# Patient Record
Sex: Male | Born: 1995 | Race: White | Hispanic: No | Marital: Single | Smoking: Never smoker
Health system: Southern US, Community
[De-identification: ages and names within clinical notes are randomized; demographics above are authoritative.]

---

## 2019-02-28 ENCOUNTER — Ambulatory Visit (HOSPITAL_COMMUNITY)
Admission: EM | Admit: 2019-02-28 | Discharge: 2019-02-28 | Disposition: A | Discharge status: Home / Self Care | Payer: Self-pay | Attending: Family Medicine | Admitting: Family Medicine

## 2019-02-28 ENCOUNTER — Encounter (HOSPITAL_COMMUNITY): Payer: Self-pay

## 2019-02-28 ENCOUNTER — Other Ambulatory Visit: Payer: Self-pay

## 2019-02-28 DIAGNOSIS — J45909 Unspecified asthma, uncomplicated: Secondary | ICD-10-CM

## 2019-02-28 MED ORDER — PREDNISONE 10 MG PO TABS: 20.0000 mg | ORAL_TABLET | Freq: Every day | ORAL | 0 refills | Status: DC

## 2019-02-28 NOTE — ED Provider Notes (Signed)
[facility]    CSN: [id number] Arrival date & time: 02/28/19  1412      History   Chief Complaint Chief Complaint  Patient presents with  . Back Pain    HPI Jvon Meroney is a 23 y.o. male.   Back pain seems to be part of the problem but most of the symptoms that he describes relate to some chest discomfort and shortness of breath.  He denies asthma or wheezing.  He has some cough.  He does smoke occasionally.  HPI  History reviewed. No pertinent past medical history.  There are no active problems to display for this patient.   History reviewed. No pertinent surgical history.     Home Medications    Prior to Admission medications   Not on File    Family History History reviewed. No pertinent family history.  Social History Social History   Tobacco Use  . Smoking status: Current Every Day Smoker  . Smokeless tobacco: Never Used  Substance Use Topics  . Alcohol use: Yes  . Drug use: Never     Allergies   Patient has no known allergies.   Review of Systems Review of Systems  Constitutional: Negative.   HENT: Negative.   Respiratory: Positive for shortness of breath.   Cardiovascular: Positive for chest pain.  All other systems reviewed and are negative.    Physical Exam Triage Vital Signs ED Triage Vitals  Enc Vitals Group     BP 02/28/19 1436 139/73     Pulse Rate 02/28/19 1436 77     Resp 02/28/19 1436 16     Temp 02/28/19 1436 98.4 F (36.9 C)     Temp Source 02/28/19 1436 Oral     SpO2 02/28/19 1436 99 %     Weight 02/28/19 1432 160 lb (72.6 kg)     Height --      Head Circumference --      Peak Flow --      Pain Score 02/28/19 1432 3     Pain Loc --      Pain Edu? --      Excl. in [place]? --    No data found.  Updated Vital Signs BP 139/73 (BP Location: Right Arm)   Pulse 77   Temp 98.4 F (36.9 C) (Oral)   Resp 16   Wt 72.6 kg   SpO2 99%   Visual Acuity Right Eye Distance:   Left Eye Distance:    Bilateral Distance:    Right Eye Near:   Left Eye Near:    Bilateral Near:     Physical Exam Constitutional:      Appearance: Normal appearance.  Cardiovascular:     Rate and Rhythm: Normal rate and regular rhythm.  Pulmonary:     Effort: Pulmonary effort is normal.     Breath sounds: Normal breath sounds.  Musculoskeletal: Normal range of motion.  Neurological:     General: No focal deficit present.     Mental Status: He is alert.      UC Treatments / Results  Labs (all labs ordered are listed, but only abnormal results are displayed) Labs Reviewed - No data to display  EKG   Radiology No results found.  Procedures Procedures (including critical care time)  Medications Ordered in UC Medications - No data to display  Initial Impression / Assessment and Plan / UC Course  I have reviewed the triage vital signs and the nursing notes.  Pertinent labs &  imaging results that were available during my care of the patient were reviewed by me and considered in my medical decision making (see chart for details).   Symptoms suggest reactive airways but are vague. Cannot afford steroid inhaler so will try tapered dose oral steroid   Final Clinical Impressions(s) / UC Diagnoses   Final diagnoses:  None   Discharge Instructions   None    ED Prescriptions    None     Controlled Substance Prescriptions [place] Controlled Substance Registry consulted? No   Frederica KusterMiller, Windell Musson M, MD 02/28/19 [id number]

## 2019-02-28 NOTE — ED Triage Notes (Signed)
Pt states he has pain  in his back that radiate into his chest x 2 weeks pt states he took Ibuprofen to relief the pain.

## 2019-05-24 ENCOUNTER — Encounter (HOSPITAL_COMMUNITY): Payer: Self-pay

## 2019-05-24 ENCOUNTER — Encounter (HOSPITAL_COMMUNITY)
Admission: EM | Disposition: A | Discharge status: Home / Self Care | Payer: Self-pay | Source: Home / Self Care | Attending: Emergency Medicine

## 2019-05-24 ENCOUNTER — Ambulatory Visit (HOSPITAL_COMMUNITY)
Admission: EM | Admit: 2019-05-24 | Discharge: 2019-05-25 | Disposition: A | Discharge status: Home / Self Care | Payer: Self-pay | Attending: Emergency Medicine | Admitting: Emergency Medicine

## 2019-05-24 ENCOUNTER — Other Ambulatory Visit: Payer: Self-pay

## 2019-05-24 ENCOUNTER — Emergency Department (HOSPITAL_COMMUNITY): Payer: Self-pay

## 2019-05-24 ENCOUNTER — Encounter (HOSPITAL_COMMUNITY): Payer: Self-pay | Admitting: Emergency Medicine

## 2019-05-24 ENCOUNTER — Ambulatory Visit (HOSPITAL_COMMUNITY)
Admission: EM | Admit: 2019-05-24 | Discharge: 2019-05-24 | Payer: Self-pay | Attending: Family Medicine | Admitting: Family Medicine

## 2019-05-24 DIAGNOSIS — S60511A Abrasion of right hand, initial encounter: Secondary | ICD-10-CM | POA: Insufficient documentation

## 2019-05-24 DIAGNOSIS — S02651A Fracture of angle of right mandible, initial encounter for closed fracture: Secondary | ICD-10-CM | POA: Insufficient documentation

## 2019-05-24 DIAGNOSIS — S02602A Fracture of unspecified part of body of left mandible, initial encounter for closed fracture: Secondary | ICD-10-CM | POA: Insufficient documentation

## 2019-05-24 DIAGNOSIS — Z23 Encounter for immunization: Secondary | ICD-10-CM | POA: Insufficient documentation

## 2019-05-24 DIAGNOSIS — S0181XA Laceration without foreign body of other part of head, initial encounter: Secondary | ICD-10-CM

## 2019-05-24 DIAGNOSIS — F17200 Nicotine dependence, unspecified, uncomplicated: Secondary | ICD-10-CM | POA: Insufficient documentation

## 2019-05-24 DIAGNOSIS — S02609A Fracture of mandible, unspecified, initial encounter for closed fracture: Secondary | ICD-10-CM

## 2019-05-24 DIAGNOSIS — S61211A Laceration without foreign body of left index finger without damage to nail, initial encounter: Secondary | ICD-10-CM | POA: Insufficient documentation

## 2019-05-24 DIAGNOSIS — S60512A Abrasion of left hand, initial encounter: Secondary | ICD-10-CM | POA: Insufficient documentation

## 2019-05-24 DIAGNOSIS — S0993XA Unspecified injury of face, initial encounter: Secondary | ICD-10-CM

## 2019-05-24 DIAGNOSIS — S0990XA Unspecified injury of head, initial encounter: Secondary | ICD-10-CM

## 2019-05-24 DIAGNOSIS — Z20828 Contact with and (suspected) exposure to other viral communicable diseases: Secondary | ICD-10-CM | POA: Insufficient documentation

## 2019-05-24 DIAGNOSIS — S01112A Laceration without foreign body of left eyelid and periocular area, initial encounter: Secondary | ICD-10-CM | POA: Insufficient documentation

## 2019-05-24 HISTORY — PX: CLOSED REDUCTION MANDIBLE WITH MANDIBULOMA: SHX5313

## 2019-05-24 HISTORY — PX: FACIAL LACERATION REPAIR: SHX6589

## 2019-05-24 LAB — RESPIRATORY PANEL BY RT PCR (FLU A&B, COVID)
Influenza A by PCR: NEGATIVE
Influenza B by PCR: NEGATIVE
SARS Coronavirus 2 by RT PCR: NEGATIVE

## 2019-05-24 SURGERY — CLOSED REDUCTION, MANDIBLE, WITH ARCH BAR APPLICATION AND INTERMAXILLARY FIXATION
Anesthesia: General | Site: Mouth

## 2019-05-24 MED ORDER — BACITRACIN ZINC 500 UNIT/GM EX OINT
TOPICAL_OINTMENT | Freq: Once | CUTANEOUS | Status: AC
  Administered 2019-05-24: 1 via TOPICAL

## 2019-05-24 MED ORDER — ARTIFICIAL TEARS OPHTHALMIC OINT
TOPICAL_OINTMENT | OPHTHALMIC | Status: AC
  Filled 2019-05-24: qty 3.5

## 2019-05-24 MED ORDER — SUCCINYLCHOLINE CHLORIDE 200 MG/10ML IV SOSY
PREFILLED_SYRINGE | INTRAVENOUS | Status: AC
  Filled 2019-05-24: qty 10

## 2019-05-24 MED ORDER — MORPHINE SULFATE (PF) 4 MG/ML IV SOLN
4.0000 mg | Freq: Once | INTRAVENOUS | Status: AC
  Administered 2019-05-24: 23:00:00 4 mg via INTRAVENOUS
  Filled 2019-05-24: qty 1

## 2019-05-24 MED ORDER — LIDOCAINE-EPINEPHRINE (PF) 2 %-1:200000 IJ SOLN
10.0000 mL | Freq: Once | INTRAMUSCULAR | Status: AC
  Administered 2019-05-24: 10 mL
  Filled 2019-05-24: qty 20

## 2019-05-24 MED ORDER — LIDOCAINE 2% (20 MG/ML) 5 ML SYRINGE
INTRAMUSCULAR | Status: AC
  Filled 2019-05-24: qty 5

## 2019-05-24 MED ORDER — MIDAZOLAM HCL 2 MG/2ML IJ SOLN
INTRAMUSCULAR | Status: AC
  Filled 2019-05-24: qty 2

## 2019-05-24 MED ORDER — EPHEDRINE 5 MG/ML INJ
INTRAVENOUS | Status: AC
  Filled 2019-05-24: qty 10

## 2019-05-24 MED ORDER — ONDANSETRON HCL 4 MG/2ML IJ SOLN
4.0000 mg | Freq: Once | INTRAMUSCULAR | Status: AC
  Administered 2019-05-24: 23:00:00 4 mg via INTRAVENOUS
  Filled 2019-05-24: qty 2

## 2019-05-24 MED ORDER — ROCURONIUM BROMIDE 10 MG/ML (PF) SYRINGE
PREFILLED_SYRINGE | INTRAVENOUS | Status: AC
  Filled 2019-05-24: qty 10

## 2019-05-24 MED ORDER — ONDANSETRON HCL 4 MG/2ML IJ SOLN
INTRAMUSCULAR | Status: AC
  Filled 2019-05-24: qty 2

## 2019-05-24 MED ORDER — FENTANYL CITRATE (PF) 250 MCG/5ML IJ SOLN
INTRAMUSCULAR | Status: AC
  Filled 2019-05-24: qty 5

## 2019-05-24 MED ORDER — LIDOCAINE-EPINEPHRINE 1 %-1:100000 IJ SOLN
INTRAMUSCULAR | Status: AC
  Filled 2019-05-24: qty 1

## 2019-05-24 MED ORDER — PHENYLEPHRINE 40 MCG/ML (10ML) SYRINGE FOR IV PUSH (FOR BLOOD PRESSURE SUPPORT)
PREFILLED_SYRINGE | INTRAVENOUS | Status: AC
  Filled 2019-05-24: qty 10

## 2019-05-24 MED ORDER — LIDOCAINE HCL (PF) 1 % IJ SOLN
10.0000 mL | Freq: Once | INTRAMUSCULAR | Status: AC
  Administered 2019-05-24: 10 mL
  Filled 2019-05-24: qty 10

## 2019-05-24 MED ORDER — DIPHENHYDRAMINE HCL 50 MG/ML IJ SOLN
INTRAMUSCULAR | Status: AC
  Filled 2019-05-24: qty 1

## 2019-05-24 MED ORDER — TETANUS-DIPHTH-ACELL PERTUSSIS 5-2.5-18.5 LF-MCG/0.5 IM SUSP
0.5000 mL | Freq: Once | INTRAMUSCULAR | Status: AC
  Administered 2019-05-24: 0.5 mL via INTRAMUSCULAR
  Filled 2019-05-24: qty 0.5

## 2019-05-24 MED ORDER — PROPOFOL 10 MG/ML IV BOLUS
INTRAVENOUS | Status: AC
  Filled 2019-05-24: qty 20

## 2019-05-24 SURGICAL SUPPLY — 41 items
BLADE SURG 15 STRL LF DISP TIS (BLADE) IMPLANT
BLADE SURG 15 STRL SS (BLADE)
CANISTER SUCT 3000ML PPV (MISCELLANEOUS) ×4 IMPLANT
COVER SURGICAL LIGHT HANDLE (MISCELLANEOUS) ×4 IMPLANT
COVER WAND RF STERILE (DRAPES) IMPLANT
DECANTER SPIKE VIAL GLASS SM (MISCELLANEOUS) IMPLANT
DRAPE HALF SHEET 40X57 (DRAPES) IMPLANT
ELECT COATED BLADE 2.86 ST (ELECTRODE) IMPLANT
ELECT NEEDLE BLADE 2-5/6 (NEEDLE) ×4 IMPLANT
ELECT REM PT RETURN 9FT ADLT (ELECTROSURGICAL) ×4
ELECTRODE REM PT RTRN 9FT ADLT (ELECTROSURGICAL) ×2 IMPLANT
GAUZE 4X4 16PLY RFD (DISPOSABLE) IMPLANT
GLOVE BIOGEL M 7.0 STRL (GLOVE) IMPLANT
GLOVE BIOGEL PI IND STRL 7.0 (GLOVE) ×4 IMPLANT
GLOVE BIOGEL PI INDICATOR 7.0 (GLOVE) ×4
GLOVE ECLIPSE 7.5 STRL STRAW (GLOVE) ×4 IMPLANT
GOWN STRL REUS W/ TWL LRG LVL3 (GOWN DISPOSABLE) ×4 IMPLANT
GOWN STRL REUS W/TWL LRG LVL3 (GOWN DISPOSABLE) ×4
KIT BASIN OR (CUSTOM PROCEDURE TRAY) ×4 IMPLANT
KIT TURNOVER KIT B (KITS) ×4 IMPLANT
NEEDLE HYPO 25GX1X1/2 BEV (NEEDLE) IMPLANT
NEEDLE PRECISIONGLIDE 27X1.5 (NEEDLE) ×4 IMPLANT
NS IRRIG 1000ML POUR BTL (IV SOLUTION) ×4 IMPLANT
PAD ARMBOARD 7.5X6 YLW CONV (MISCELLANEOUS) ×8 IMPLANT
PENCIL BUTTON HOLSTER BLD 10FT (ELECTRODE) ×4 IMPLANT
SCREW UPPER FACE 2.0X8MM (Screw) ×16 IMPLANT
SOL PREP POV-IOD 4OZ 10% (MISCELLANEOUS) ×4 IMPLANT
SUCTION FRAZIER HANDLE 10FR (MISCELLANEOUS)
SUCTION TUBE FRAZIER 10FR DISP (MISCELLANEOUS) IMPLANT
SUT CHROMIC 3 0 PS 2 (SUTURE) IMPLANT
SUT CHROMIC 4 0 PS 2 18 (SUTURE) IMPLANT
SUT PROLENE 5 0 PS 2 (SUTURE) ×4 IMPLANT
SUT STEEL 1 (SUTURE) ×4 IMPLANT
SUT VIC AB 4-0 PS2 18 (SUTURE) ×4 IMPLANT
SYR CONTROL 10ML LL (SYRINGE) ×4 IMPLANT
TOOTHBRUSH ADULT (PERSONAL CARE ITEMS) ×4 IMPLANT
TRAY ENT MC OR (CUSTOM PROCEDURE TRAY) ×4 IMPLANT
TUBE CONNECTING 12'X1/4 (SUCTIONS) ×1
TUBE CONNECTING 12X1/4 (SUCTIONS) ×3 IMPLANT
WATER STERILE IRR 1000ML POUR (IV SOLUTION) ×4 IMPLANT
YANKAUER SUCT BULB TIP NO VENT (SUCTIONS) ×4 IMPLANT

## 2019-05-24 NOTE — Anesthesia Preprocedure Evaluation (Addendum)
Anesthesia Evaluation  Patient identified by MRN, date of birth, ID band Patient awake    Reviewed: Allergy & Precautions, NPO status , Patient's Chart, lab work & pertinent test results  History of Anesthesia Complications Negative for: history of anesthetic complications  Airway Mallampati: IV  TM Distance: >3 FB Neck ROM: Full  Mouth opening: Limited Mouth Opening  Dental  (+) Loose, Dental Advisory Given   Pulmonary neg recent URI, Current Smoker and Patient abstained from smoking.,    breath sounds clear to auscultation       Cardiovascular negative cardio ROS   Rhythm:Regular     Neuro/Psych negative neurological ROS  negative psych ROS   GI/Hepatic negative GI ROS, (+)     substance abuse  marijuana use,   Endo/Other  negative endocrine ROS  Renal/GU negative Renal ROS     Musculoskeletal negative musculoskeletal ROS (+)   Abdominal   Peds  Hematology negative hematology ROS (+)   Anesthesia Other Findings   Reproductive/Obstetrics                           Anesthesia Physical Anesthesia Plan  ASA: II  Anesthesia Plan: General   Post-op Pain Management:    Induction: Intravenous and Rapid sequence  PONV Risk Score and Plan: 1 and Ondansetron and Dexamethasone  Airway Management Planned: Nasal ETT  Additional Equipment: None  Intra-op Plan:   Post-operative Plan: Extubation in OR  Informed Consent: I have reviewed the patients History and Physical, chart, labs and discussed the procedure including the risks, benefits and alternatives for the proposed anesthesia with the patient or authorized representative who has indicated his/her understanding and acceptance.     Dental advisory given  Plan Discussed with: CRNA, Surgeon and Anesthesiologist  Anesthesia Plan Comments:        Anesthesia Quick Evaluation
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
## 2019-05-24 NOTE — Discharge Instructions (Addendum)
You will need special jaw films to better evaluate the jaw pain The lacerations will also need attention in the emergency department.
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
## 2019-05-24 NOTE — ED Notes (Signed)
Deep laceration over left eye bleeding, gauze applied and pt instructed to hold pressure. Pressure bandage applied to finger on left hand where there is a deep laceration, no bone visible.
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
## 2019-05-25 ENCOUNTER — Emergency Department (HOSPITAL_COMMUNITY): Payer: Self-pay | Admitting: Anesthesiology

## 2019-05-25 ENCOUNTER — Encounter (HOSPITAL_COMMUNITY): Payer: Self-pay | Admitting: Otolaryngology

## 2019-05-25 MED ORDER — CLINDAMYCIN PHOSPHATE 900 MG/50ML IV SOLN
INTRAVENOUS | Status: AC
  Filled 2019-05-25: qty 50

## 2019-05-25 MED ORDER — DEXAMETHASONE SODIUM PHOSPHATE 4 MG/ML IJ SOLN
INTRAMUSCULAR | Status: DC | PRN
  Administered 2019-05-25: 10 mg via INTRAVENOUS

## 2019-05-25 MED ORDER — SUCCINYLCHOLINE CHLORIDE 200 MG/10ML IV SOSY
PREFILLED_SYRINGE | INTRAVENOUS | Status: DC | PRN
  Administered 2019-05-25: 100 mg via INTRAVENOUS

## 2019-05-25 MED ORDER — ACETAMINOPHEN 160 MG/5ML PO SOLN: 1000.0000 mg | Freq: Once | ORAL | Status: DC | PRN

## 2019-05-25 MED ORDER — ONDANSETRON HCL 4 MG/2ML IJ SOLN
INTRAMUSCULAR | Status: DC | PRN
  Administered 2019-05-25: 4 mg via INTRAVENOUS

## 2019-05-25 MED ORDER — PROPOFOL 10 MG/ML IV BOLUS
INTRAVENOUS | Status: DC | PRN
  Administered 2019-05-25: 140 mg via INTRAVENOUS

## 2019-05-25 MED ORDER — FENTANYL CITRATE (PF) 250 MCG/5ML IJ SOLN
INTRAMUSCULAR | Status: DC | PRN
  Administered 2019-05-25 (×5): 50 ug via INTRAVENOUS

## 2019-05-25 MED ORDER — OXYCODONE HCL 5 MG/5ML PO SOLN: 5.0000 mg | Freq: Once | ORAL | Status: DC | PRN

## 2019-05-25 MED ORDER — MIDAZOLAM HCL 5 MG/5ML IJ SOLN
INTRAMUSCULAR | Status: DC | PRN
  Administered 2019-05-25: 2 mg via INTRAVENOUS

## 2019-05-25 MED ORDER — CHLORHEXIDINE GLUCONATE 0.12 % MT SOLN
OROMUCOSAL | Status: AC
  Filled 2019-05-25: qty 15

## 2019-05-25 MED ORDER — CLINDAMYCIN PHOSPHATE 900 MG/50ML IV SOLN
INTRAVENOUS | Status: DC | PRN
  Administered 2019-05-25: 900 mg via INTRAVENOUS

## 2019-05-25 MED ORDER — LIDOCAINE-EPINEPHRINE 1 %-1:100000 IJ SOLN
INTRAMUSCULAR | Status: DC | PRN
  Administered 2019-05-25: 20 mL via INTRADERMAL

## 2019-05-25 MED ORDER — HYDROCODONE-ACETAMINOPHEN 7.5-325 MG/15ML PO SOLN: 15.0000 mL | ORAL | 0 refills | Status: AC | PRN

## 2019-05-25 MED ORDER — ACETAMINOPHEN 10 MG/ML IV SOLN: 1000.0000 mg | Freq: Once | INTRAVENOUS | Status: DC | PRN

## 2019-05-25 MED ORDER — DIPHENHYDRAMINE HCL 50 MG/ML IJ SOLN
INTRAMUSCULAR | Status: DC | PRN
  Administered 2019-05-25: 25 mg via INTRAVENOUS

## 2019-05-25 MED ORDER — OXYCODONE HCL 5 MG PO TABS: 5.0000 mg | ORAL_TABLET | Freq: Once | ORAL | Status: DC | PRN

## 2019-05-25 MED ORDER — OXYMETAZOLINE HCL 0.05 % NA SOLN
NASAL | Status: AC
  Filled 2019-05-25: qty 30

## 2019-05-25 MED ORDER — CHLORHEXIDINE GLUCONATE 0.12 % MT SOLN
OROMUCOSAL | Status: DC | PRN
  Administered 2019-05-25: 15 mL via OROMUCOSAL

## 2019-05-25 MED ORDER — AMOXICILLIN 400 MG/5ML PO SUSR: 800.0000 mg | Freq: Two times a day (BID) | ORAL | 0 refills | Status: AC

## 2019-05-25 MED ORDER — LIDOCAINE HCL (CARDIAC) PF 100 MG/5ML IV SOSY
PREFILLED_SYRINGE | INTRAVENOUS | Status: DC | PRN
  Administered 2019-05-25: 60 mg via INTRATRACHEAL

## 2019-05-25 MED ORDER — STERILE WATER FOR IRRIGATION IR SOLN
Status: DC | PRN
  Administered 2019-05-25: 500 mL

## 2019-05-25 MED ORDER — LACTATED RINGERS IV SOLN
INTRAVENOUS | Status: DC | PRN
  Administered 2019-05-25 (×2): via INTRAVENOUS

## 2019-05-25 MED ORDER — ACETAMINOPHEN 500 MG PO TABS: 1000.0000 mg | ORAL_TABLET | Freq: Once | ORAL | Status: DC | PRN

## 2019-05-25 MED ORDER — OXYMETAZOLINE HCL 0.05 % NA SOLN
NASAL | Status: DC | PRN
  Administered 2019-05-25: 2 via NASAL

## 2019-05-25 MED ORDER — CHLORHEXIDINE GLUCONATE 0.12 % MT SOLN: OROMUCOSAL | 3 refills | Status: DC

## 2019-05-25 MED ORDER — FENTANYL CITRATE (PF) 100 MCG/2ML IJ SOLN: 25.0000 ug | INTRAMUSCULAR | Status: DC | PRN

## 2019-05-25 MED ORDER — 0.9 % SODIUM CHLORIDE (POUR BTL) OPTIME
TOPICAL | Status: DC | PRN
  Administered 2019-05-25: 1000 mL

## 2019-05-25 NOTE — Anesthesia Procedure Notes (Signed)
Procedure Name: Intubation Date/Time: 05/25/2019 12:48 AM Performed by: Clovis Cao, CRNA Pre-anesthesia Checklist: Patient identified, Emergency Drugs available, Suction available, Patient being monitored and Timeout performed Patient Re-evaluated:Patient Re-evaluated prior to induction Oxygen Delivery Method: Circle system utilized Preoxygenation: Pre-oxygenation with 100% oxygen Induction Type: IV induction, Rapid sequence and Cricoid Pressure applied Laryngoscope Size: Mac and 3 Grade View: Grade I Nasal Tubes: Right, Nasal prep performed, Nasal Rae and Magill forceps- large, utilized Tube size: 7.5 mm Number of attempts: 1 Placement Confirmation: ETT inserted through vocal cords under direct vision,  positive ETCO2 and breath sounds checked- equal and bilateral Secured at: 28 cm Tube secured with: Tape Dental Injury: Teeth and Oropharynx as per pre-operative assessment
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
## 2019-05-25 NOTE — Transfer of Care (Signed)
Immediate Anesthesia Transfer of Care Note  Patient: Jahi Roza  Procedure(s) Performed: CLOSED REDUCTION MANDIBLE (N/A Mouth)  Patient Location: PACU  Anesthesia Type:General  Level of Consciousness: drowsy  Airway & Oxygen Therapy: Patient Spontanous Breathing  Post-op Assessment: Report given to RN and Post -op Vital signs reviewed and stable  Post vital signs: Reviewed and stable  Last Vitals:  Vitals Value Taken Time  BP 143/92 05/25/19 0129  Temp    Pulse 85 05/25/19 0130  Resp 17 05/25/19 0130  SpO2 99 % 05/25/19 0130  Vitals shown include unvalidated device data.  Last Pain:  Vitals:   05/24/19 1955  TempSrc:   PainSc: 5          Complications: No apparent anesthesia complications
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
## 2019-05-25 NOTE — Discharge Instructions (Addendum)
Post Anesthesia Home Care Instructions  Activity: Get plenty of rest for the remainder of the day. A responsible individual must stay with you for 24 hours following the procedure.  For the next 24 hours, DO NOT: -Drive a car -Advertising copywriter -Drink alcoholic beverages -Take any medication unless instructed by your physician -Make any legal decisions or sign important papers.  Meals: Start with liquid foods such as gelatin or soup. Progress to regular foods as tolerated. Avoid greasy, spicy, heavy foods. If nausea and/or vomiting occur, drink only clear liquids until the nausea and/or vomiting subsides. Call your physician if vomiting continues.  Special Instructions/Symptoms: Your throat may feel dry or sore from the anesthesia or the breathing tube placed in your throat during surgery. If this causes discomfort, gargle with warm salt water. The discomfort should disappear within 24 hours.  If you had a scopolamine patch placed behind your ear for the management of post- operative nausea and/or vomiting:  1. The medication in the patch is effective for 72 hours, after which it should be removed.  Wrap patch in a tissue and discard in the trash. Wash hands thoroughly with soap and water. 2. You may remove the patch earlier than 72 hours if you experience unpleasant side effects which may include dry mouth, dizziness or visual disturbances. 3. Avoid touching the patch. Wash your hands with soap and water after contact with the patch.      Facial Laceration A facial laceration is a cut on the face. This can happen because of an accident or injury that cuts or tears the skin or tissues on your face. These injuries can hurt and bleed. Some cuts may need to be closed with stitches (sutures), skin glue, or skin tape (adhesive) strips. Cuts usually heal quickly, but they can leave a scar. It can take 1-2 years for the scar to go away completely. Follow these instructions at home: Wound  care  Follow your doctor's instructions for wound care. These instructions will vary depending on how the wound was closed. ? For stitches:  Keep the wound clean and dry.  If you were given a bandage (dressing), change it at least one time a day, or as told by your doctor. Also change the bandage if it gets wet or dirty.  Wash the wound with soap and water two times a day, or as told by your doctor. Rinse off the soap with water. Use a clean towel to pat the wound dry.  After cleaning, apply a thin layer of antibiotic ointment as told by your doctor. This helps prevent infection and keeps the bandage from sticking to the wound.  You may shower as usual after the first 24 hours. Do not soak the wound until the stitches are taken out.  Go back to have your stitches taken out as told by your doctor.  Do not wear makeup until your doctor says it is okay. ? For skin tape strips:  Keep the wound clean and dry.  Do not let the skin tape strips get wet.  Bathe carefully to keep the wound and skin tape strips dry. If the wound gets wet, pat it dry with a clean towel right away.  Skin tape strips fall off on their own over time. You may trim the strips as the wound heals. Do not take off skin tape strips that are still stuck to the wound. ? For skin glue:  You may briefly wet your wound in the shower or bath.  Do not soak or scrub the wound.  Do not swim.  Do not do anything that makes you sweat a lot until the skin glue has fallen off on its own.  After you shower or take a bath, use a clean towel to gently pat the wound dry.  Do not put liquid medicine, cream medicine, ointment, or makeup on your wound while the skin glue is in place. This may loosen the film before your wound is healed.  If you have a bandage over your wound, be careful not to apply tape directly over the skin glue. This may pull off the skin glue before the wound is healed.  Do not spend a long time in the sun or  use a tanning lamp while the skin glue is in place.  The skin glue usually stays in place for 5-10 days. Then, it naturally falls off the skin. Do not pick at the skin glue. General instructions   Take over-the-counter and prescription medicines only as told by your doctor.  Check your wound area every day for signs of infection. Check for: ? More redness, swelling, or pain. ? More fluid or blood. ? Warmth. ? Pus or a bad smell.  If you were prescribed an antibiotic, take or apply it as told by your doctor. Do not stop taking or applying the antibiotic even if your condition improves.  After the cut has healed: ? Know that it can take a year or two for redness or scarring to fade. ? Apply sunscreen to the skin of your healed wound to minimize scarring. Ultraviolet (UV) rays can darken scar tissue. Contact a doctor if:  You have a fever.  You have more redness, swelling, or pain around your wound.  You have more fluid or blood coming from your wound.  Your wound feels warm to the touch.  You have pus or a bad smell coming from your wound. Get help right away if:  You have a red streak going away from your wound. Summary  A cut on the face (facial laceration) may need to be closed with stitches (sutures), skin tape strips, or skin glue.  Follow your doctor's instructions for wound care.  Check your wound area every day for signs of infection such as redness, swelling, or drainage. This information is not intended to replace advice given to you by your health care provider. Make sure you discuss any questions you have with your health care provider. Document Released: 11/20/2007 Document Revised: 09/24/2018 Document Reviewed: 07/04/2016 Elsevier Patient Education  2020 [employer].   How to Use Cold Therapy Cold therapy, or cryotherapy, is a treatment that uses cold temperatures to treat an injury or medical condition. It includes using cold packs or ice packs to reduce  pain and swelling. Only use cold therapy if your doctor says it is okay. What are the risks? Generally, cold therapy is a safe treatment. However, it is not safe for:  People who are not able to say they are in pain. These include small children and people who have memory problems.  People who have certain conditions, such as: ? A problem in the vessels that slows blood flow to the fingers and toes (Raynaud's syndrome). ? Feeling very cold easily (cold hypersensitivity). ? Lack of feeling in the area being iced. Cold therapy may not be safe for people who have other conditions. Do not use it without talking to your doctor if you have:  A heart condition.  High blood  pressure.  Open or healing wounds.  An infection.  Pain and swelling in your joints (rheumatoid arthritis).  Poor blood flow in the body.  Diabetes.  Certain skin conditions. How can I make a cold pack? When using a cold pack at home to reduce pain and swelling, you can use:  A silica gel cold pack that has been left in the freezer. You can buy this online or in stores.  A sealable plastic bag that has been filled with crushed ice.  A washcloth or paper towels soaked in cold (or ice) water.  A plastic bag of frozen vegetables. Throw them away when you are finished using them as a cold pack. Supplies needed:  A cold pack.  A towel. This can be dry or damp, based on what you like. How to use cold therapy  1. Have your cold pack ready. 2. Place a towel between the cold pack and your skin. You may also wrap the cold pack in a towel. 3. Put the cold pack on the affected area. Keep it on for no more than 20 minutes at a time. 4. Check your skin after 5 minutes to make sure that there is no damage to the area. Check for: ? White spots on your skin. Your skin may look blotchy or mottled. ? Skin that looks blue or pale. ? Skin that feels waxy or hard. 5. Repeat these steps as many times each day as told by your  doctor. Always use a towel to avoid direct contact with your skin. Contact a doctor if:  You start to have white spots on your skin. This may give your skin a blotchy or mottled look.  Your skin turns blue or pale.  Your skin becomes waxy or hard.  Your swelling gets worse. Summary  Cold therapy, or cryotherapy, is used to treat an injury or other conditions. It includes using cold packs or ice packs to reduce pain and swelling.  Cold therapy is not safe for people who are not able to say they are in pain.  When using cold packs or ice packs, always place a towel between the cold source and your skin.  Check your skin after 5 minutes of icing it. This is to make sure that there is no skin damage.  Contact your doctor if you notice changes in your skin or your swelling gets worse. This information is not intended to replace advice given to you by your health care provider. Make sure you discuss any questions you have with your health care provider. Document Released: 11/20/2007 Document Revised: 03/02/2018 Document Reviewed: 03/02/2018 Elsevier Patient Education  2020 [employer]   Jaw Fracture Eating Plan A break (fracture) of the jaw bone often needs surgery for treatment. After surgery, you will need to eat foods that can be blended so that they can be sipped from a straw or given through a syringe. Work with a diet and nutrition specialist (dietitian) to create an eating plan that helps you get the nutrients you need in order to heal and stay healthy. What are tips for following this plan? General guidelines  All foods in this plan must be blended. Avoid nuts, seeds, skins, peels, bones, or any foods that cannot be blended to the right consistency.  Ask your health care provider about taking a liquid multivitamin to make sure that you get all the vitamins and minerals you need. Cooking   Before blending, remove any skins, seeds, or peels from food.  Cook meats and  vegetables until tender.  Cut foods into small pieces and mix with a small amount of liquid in a food processor or blender. Continue to add liquid until the food becomes thin enough to sip through a straw.  Add liquids such as juice, milk, cream, broth, gravy, or vegetable juice to help add flavor to foods.  Heat foods after they have been blended, not before. This reduces the amount of foam created from blending.  If you need to increase calories in food: ? Add protein powder or powdered milk to foods. ? Cook with fats, such as margarine (without trans fat), sour cream, cream cheese, cream, or nut butters. ? Prepare foods with sweeteners, such as honey, ice cream, blackstrap molasses, or sugar. Meal planning  Eat at least three meals and three snacks daily. It is important to make sure that you get enough calories and protein to prevent weight loss and help your body heal, especially after surgery.  Eat a variety of foods from each food group every day, including fruits and vegetables, protein, whole grains, dairy, and healthy fats.  If your teeth and mouth are sensitive to extreme temperatures, heat or cool your foods to lukewarm temperatures. What foods are recommended? The items listed may not be a complete list. Talk with your dietitian about what dietary choices are best for you. Grains Hot cereals, such as oatmeal, grits, ground wheat cereals, and polenta. Rice and pasta. Couscous. Vegetables All cooked or canned vegetables, without seeds and skins. Vegetable juices. Cooked potatoes, without skins. Fruits Any cooked or canned fruits, without seeds and skins. Fresh, peeled soft fruits, such as bananas and peaches, that can be blended until smooth. All fruit juices, without seeds and skins. Meat and other protein foods Soft-boiled eggs, scrambled eggs, powdered eggs, pasteurized egg mixtures, and custard. Ground meats, such as hamburger, [place]urkey, sausage, and meatloaf. Tender,  well-cooked meat, poultry, and fish, prepared without bones or skin. Soft soy foods, such as tofu. Smooth nut butters. Liquid egg substitutes. Dairy Milk. Cheese. Yogurt. Cottage cheese. Pudding. Beverages Coffee (regular or decaffeinated), tea, and mineral water. Liquid supplements that have protein and calories. Fats and oils Any oils. Melted margarine or butter. Ghee. Sour cream. Cream cheese. Avocado. Seasoning and other foods All seasonings and condiments that blend well. Ground spices. Finely ground seeds and nuts. Mustard or any smooth condiment. Summary  Foods in this plan need to be prepared so that they can be sipped from a straw or given through a syringe. Try to have at least three meals and three snacks daily.  Avoid nuts, seeds, skins, peels, bones, or any foods that cannot be blended to the right consistency. Make sure you eat a variety of foods from each food group every day.  Include a liquid multivitamin in your plan as told by your health care provider or dietitian. This information is not intended to replace advice given to you by your health care provider. Make sure you discuss any questions you have with your health care provider. Document Released: 11/21/2009 Document Revised: 09/25/2018 Document Reviewed: 09/10/2016 Elsevier Patient Education  2020 [employer] Inc[employer]   How to Care for an External Fixator  An external fixator is a device that holds a broken bone in a stable (fixed) position until it heals. This device is often used on the arms, legs, pelvis, or neck. It is attached to the bones with screws that are called pins or bolts. The pins are drilled into the bone on  each side of the break and are attached to a metal or carbon fiber rod with nuts. When the bone is fully healed, the external fixator is removed. The type of external fixator that you have will depend on which bone is broken. Most people need an external fixator for 6-12 weeks. Sometimes, it is needed  for up to 1 year for fractures that heal slowly or fractures that require slow alteration in alignment. What are the risks? Generally, external fixators are safe to wear. However, problems may occur, including infection at the pin sites. Supplies needed:  Pin-cleaning solution.  Sterile cotton swabs.  Sterile split gauze.  Square gauze.  Clean washcloth.  Towel. How to clean your pin sites Clean the pin sites two times a day or as often as told by your health care provider. Pin sites are the spots where the pins go through your skin. There are various types of pin-cleaning solutions. Use the solution that is recommended by your health care provider. 1. Wash your hands with soap and water. 2. Remove the old split gauze from each pin and discard it in a trash bin. 3. Wash your hands again with soap and water. 4. Pour the cleaning solution in a sterile container. 5. Dip a cotton swab in the solution. Swab around the base of the pin, where it meets your skin. Use a circular motion. If your skin has moved up onto the pin, gently push it back down. Remove any crusts that have formed on the pin. After you have swabbed the base of the pin, clean the rest of the pin. 6. Use a new swab for each pin. 7. Dry each pin site with a clean cotton swab. 8. If directed by your health care provider, apply an antibiotic ointment. 9. Place a new split gauze on each pin. Check the pin sites every day for signs of infection. Check for:  Redness, swelling, or pain.  Fluid or blood.  Warmth.  Pus or a bad smell. How to care for your external fixator Different types of external fixators will have different instructions for care. Your health care provider will tell you how to care for the type that you have. In general, you should:  Examine your external fixator every day. 1. Check for any loose pins. One sign of looseness is pain at the site where the pin exits the skin or pain at the site of the  break. 2. Make sure the nuts are tight. Your health care provider may show you how to tighten the nuts. However, do not make any adjustments to your external fixator unless told to do so by your health care provider.  Clean the frame of the fixator two times a week or as often as told by your health care provider. 1. Use a square gauze or a clean washcloth that is moistened with rubbing alcohol and water to clean the frame. 2. After you clean the frame, wipe it dry with a towel. 3. Use a new gauze square or clean washcloth, and a clean towel for each cleaning. Follow these instructions at home: Bathing  Do not take baths, swim, or use a hot tub until your health care provider approves. Ask your health care provider if you may take showers. You may only be allowed to take sponge baths.  When you are allowed to shower, ask your health care provider if you need to cover your external fixator with a waterproof covering. General instructions  Take over-the-counter and prescription  medicines only as told by your health care provider.  Return to your normal activities as told by your health care provider. Ask your health care provider what activities are safe for you.  Keep all follow-up visits as told by your health care provider. This is important. Contact a health care provider if:  You have redness, swelling, or pain at any pin site.  You have fluid or blood coming from a pin site.  A pin site feels warm to the touch.  You have pus or a bad smell coming from a pin site.  You have a fever. Get help right away if:  A pin moves or becomes loose.  You have increasing pain where your bone was broken. Summary  An external fixator is a device that holds a broken bone in a stable (fixed) position until it heals.  Most people need an external fixator for 6-12 weeks.  Your health care provider will tell you how to care for the type of external fixator that you have.  When the bone is  fully healed, the external fixator is removed. This information is not intended to replace advice given to you by your health care provider. Make sure you discuss any questions you have with your health care provider. Document Released: 02/13/2011 Document Revised: 07/31/2018 Document Reviewed: 08/03/2018 Elsevier Patient Education  2020 [employer]   Laceration Care, Adult A laceration is a cut that may go through all layers of the skin. The cut may also go into the tissue that is right under the skin. Some cuts heal on their own. Others need to be closed with stitches (sutures), staples, skin adhesive strips, or skin glue. Taking care of your injury lowers your risk of infection, helps your injury to heal better, and may prevent scarring. Supplies needed:  Soap.  Water.  Hand sanitizer.  Bandage (dressing).  Antibiotic ointment.  Clean towel. How to take care of your cut Wash your hands with soap and water before touching your wound or changing your bandage. If soap and water are not available, use hand sanitizer. If your doctor used stitches or staples:  Keep the wound clean and dry.  If you were given a bandage, change it at least once a day as told by your doctor. You should also change it if it gets wet or dirty.  Keep the wound completely dry for the first 24 hours, or as told by your doctor. After that, you may take a shower or a bath. Do not get the wound soaked in water until after the stitches or staples have been removed.  Clean the wound once a day, or as told by your doctor: ? Wash the wound with soap and water. ? Rinse the wound with water to remove all soap. ? Pat the wound dry with a clean towel. Do not rub the wound.  After you clean the wound, put a thin layer of antibiotic ointment on it as told by your doctor. This ointment: ? Helps to prevent infection. ? Keeps the bandage from sticking to the wound.  Have your stitches or staples removed as told by  your doctor. If your doctor used skin adhesive strips:  Keep the wound clean and dry.  If you were given a bandage, you should change it at least once a day as told by your doctor. You should also change it if it gets wet or dirty.  Do not get the skin adhesive strips wet. You can take a shower  or a bath, but keep the wound dry.  If the wound gets wet, pat it dry with a clean towel. Do not rub the wound.  Skin adhesive strips fall off on their own. You can trim the strips as the wound heals. Do not remove any strips that are still stuck to the wound. They will fall off after a while. If your doctor used skin glue:  Try to keep your wound dry, but you may briefly wet it in the shower or bath. Do not soak the wound in water, such as by swimming.  After you take a shower or a bath, gently pat the wound dry with a clean towel. Do not rub the wound.  Do not do any activities that will make you really sweaty until the skin glue has fallen off on its own.  Do not apply liquid, cream, or ointment medicine to your wound while the skin glue is still on.  If you were given a bandage, you should change it at least once a day or as told by your doctor. You should also change it if it gets dirty or wet.  If a bandage is placed over the wound, do not let the tape touch the skin glue.  Do not pick at the glue. The skin glue usually stays on for 5-10 days. Then, it falls off the skin. General instructions   Take over-the-counter and prescription medicines only as told by your doctor.  If you were given antibiotic medicine or ointment, take or apply it as told by your doctor. Do not stop using it even if your condition improves.  Do not scratch or pick at the wound.  Check your wound every day for signs of infection. Watch for: ? Redness, swelling, or pain. ? Fluid, blood, or pus.  Raise (elevate) the injured area above the level of your heart while you are sitting or lying down.  If  directed, put ice on the affected area: ? Put ice in a plastic bag. ? Place a towel between your skin and the bag. ? Leave the ice on for 20 minutes, 2-3 times a day.  Prevent scarring by covering your wound with sunscreen of at least 30 SPF whenever you are outside after your wound has healed.  Keep all follow-up visits as told by your doctor. This is important. Get help if:  You got a tetanus shot and you have any of these problems at the injection site: ? Swelling. ? Very bad pain. ? Redness. ? Bleeding.  You have a fever.  A wound that was closed breaks open.  You notice a bad smell coming from your wound or your bandage.  You notice something coming out of the wound, such as wood or glass.  Medicine does not relieve your pain.  You have more redness, swelling, or pain at the site of your wound.  You have fluid, blood, or pus coming from your wound.  You notice a change in the color of your skin near your wound.  You need to change the bandage often because fluid, blood, or pus is coming from the wound.  You start to have a new rash.  You start to have numbness around the wound. Get help right away if:  You have very bad swelling around the wound.  Your pain suddenly gets worse and is very bad.  You notice painful lumps near the wound or anywhere on your body.  You have a red streak going away from  your wound.  The wound is on your hand or foot, and: ? You cannot move a finger or toe. ? Your fingers or toes look pale or bluish. Summary  A laceration is a cut that may go through all layers of the skin. The cut may also go into the tissue right under the skin.  Some cuts heal on their own. Others need to be closed with stitches, staples, skin adhesive strips, or skin glue.  Follow your doctor's instructions for caring for your cut. Proper care of a cut lowers the risk of infection, helps the cut heal better, and prevents scarring. This information is not  intended to replace advice given to you by your health care provider. Make sure you discuss any questions you have with your health care provider. Document Released: 11/20/2007 Document Revised: 08/01/2017 Document Reviewed: 06/23/2017 Elsevier Patient Education  2020 [employer] Jaw Fracture Eating Plan A break (fracture) of the jaw bone often needs surgery for treatment. After surgery, you will need to eat foods that can be blended so that they can be sipped from a straw or given through a syringe. Work with a diet and nutrition specialist (dietitian) to create an eating plan that helps you get the nutrients you need in order to heal and stay healthy. What are tips for following this plan? General guidelines  All foods in this plan must be blended. Avoid nuts, seeds, skins, peels, bones, or any foods that cannot be blended to the right consistency.  Ask your health care provider about taking a liquid multivitamin to make sure that you get all the vitamins and minerals you need. Cooking   Before blending, remove any skins, seeds, or peels from food.  Cook meats and vegetables until tender.  Cut foods into small pieces and mix with a small amount of liquid in a food processor or blender. Continue to add liquid until the food becomes thin enough to sip through a straw.  Add liquids such as juice, milk, cream, broth, gravy, or vegetable juice to help add flavor to foods.  Heat foods after they have been blended, not before. This reduces the amount of foam created from blending.  If you need to increase calories in food: ? Add protein powder or powdered milk to foods. ? Cook with fats, such as margarine (without trans fat), sour cream, cream cheese, cream, or nut butters. ? Prepare foods with sweeteners, such as honey, ice cream, blackstrap molasses, or sugar. Meal planning  Eat at least three meals and three snacks daily. It is important to make sure that you get enough calories and  protein to prevent weight loss and help your body heal, especially after surgery.  Eat a variety of foods from each food group every day, including fruits and vegetables, protein, whole grains, dairy, and healthy fats.  If your teeth and mouth are sensitive to extreme temperatures, heat or cool your foods to lukewarm temperatures. What foods are recommended? The items listed may not be a complete list. Talk with your dietitian about what dietary choices are best for you. Grains Hot cereals, such as oatmeal, grits, ground wheat cereals, and polenta. Rice and pasta. Couscous. Vegetables All cooked or canned vegetables, without seeds and skins. Vegetable juices. Cooked potatoes, without skins. Fruits Any cooked or canned fruits, without seeds and skins. Fresh, peeled soft fruits, such as bananas and peaches, that can be blended until smooth. All fruit juices, without seeds and skins. Meat and other protein foods Soft-boiled eggs,  scrambled eggs, powdered eggs, pasteurized egg mixtures, and custard. Ground meats, such as hamburger, [place], sausage, and meatloaf. Tender, well-cooked meat, poultry, and fish, prepared without bones or skin. Soft soy foods, such as tofu. Smooth nut butters. Liquid egg substitutes. Dairy Milk. Cheese. Yogurt. Cottage cheese. Pudding. Beverages Coffee (regular or decaffeinated), tea, and mineral water. Liquid supplements that have protein and calories. Fats and oils Any oils. Melted margarine or butter. Ghee. Sour cream. Cream cheese. Avocado. Seasoning and other foods All seasonings and condiments that blend well. Ground spices. Finely ground seeds and nuts. Mustard or any smooth condiment. Summary  Foods in this plan need to be prepared so that they can be sipped from a straw or given through a syringe. Try to have at least three meals and three snacks daily.  Avoid nuts, seeds, skins, peels, bones, or any foods that cannot be blended to the right consistency.  Make sure you eat a variety of foods from each food group every day.  Include a liquid multivitamin in your plan as told by your health care provider or dietitian. This information is not intended to replace advice given to you by your health care provider. Make sure you discuss any questions you have with your health care provider. Document Released: 11/21/2009 Document Revised: 09/25/2018 Document Reviewed: 09/10/2016 Elsevier Patient Education  2020 [employer].

## 2019-06-02 NOTE — Anesthesia Postprocedure Evaluation (Signed)
Anesthesia Post Note  Patient: Joseph Villanueva  Procedure(s) Performed: 1.  Mandibulomaxillary fixation.  (N/A Mouth) 2.  Complex repair of left eyebrow laceration (4 cm). (Left Eye)     Patient location during evaluation: PACU Anesthesia Type: General Level of consciousness: awake and alert Pain management: pain level controlled Vital Signs Assessment: post-procedure vital signs reviewed and stable Respiratory status: spontaneous breathing, nonlabored ventilation, respiratory function stable and patient connected to nasal cannula oxygen Cardiovascular status: blood pressure returned to baseline and stable Postop Assessment: no apparent nausea or vomiting Anesthetic complications: no    Last Vitals:  Vitals:   05/25/19 0200 05/25/19 0207  BP: (!) 144/97 (!) 143/89  Pulse: 75 80  Resp: 16 17  Temp: 36.7 C 36.7 C  SpO2: 96% 98%    Last Pain:  Vitals:   05/25/19 0200  TempSrc:   PainSc: 0-No pain                 Tracey Nicole Hafley

## 2019-06-09 NOTE — Progress Notes (Signed)
Attempted to contact patient to schedule pre-procedure appointment, no answer, left message for patient to return call to schedule COVID appotinment

## 2019-06-17 ENCOUNTER — Other Ambulatory Visit: Payer: Self-pay | Admitting: Otolaryngology

## 2019-06-19 ENCOUNTER — Other Ambulatory Visit (HOSPITAL_COMMUNITY): Admission: RE | Admit: 2019-06-19 | Payer: Self-pay | Source: Ambulatory Visit

## 2019-06-22 ENCOUNTER — Encounter (HOSPITAL_BASED_OUTPATIENT_CLINIC_OR_DEPARTMENT_OTHER): Payer: Self-pay | Admitting: Otolaryngology

## 2019-06-22 ENCOUNTER — Other Ambulatory Visit: Payer: Self-pay

## 2019-06-25 ENCOUNTER — Other Ambulatory Visit (HOSPITAL_COMMUNITY)
Admission: RE | Admit: 2019-06-25 | Discharge: 2019-06-25 | Disposition: A | Discharge status: Home / Self Care | Payer: HRSA Program | Source: Ambulatory Visit | Attending: Otolaryngology | Admitting: Otolaryngology

## 2019-06-25 DIAGNOSIS — Z20822 Contact with and (suspected) exposure to covid-19: Secondary | ICD-10-CM | POA: Diagnosis not present

## 2019-06-25 DIAGNOSIS — Z01812 Encounter for preprocedural laboratory examination: Secondary | ICD-10-CM | POA: Diagnosis present

## 2019-06-26 LAB — NOVEL CORONAVIRUS, NAA (HOSP ORDER, SEND-OUT TO REF LAB; TAT 18-24 HRS): SARS-CoV-2, NAA: NOT DETECTED

## 2019-06-29 ENCOUNTER — Other Ambulatory Visit: Payer: Self-pay

## 2019-06-29 ENCOUNTER — Ambulatory Visit (HOSPITAL_BASED_OUTPATIENT_CLINIC_OR_DEPARTMENT_OTHER)
Admission: RE | Admit: 2019-06-29 | Discharge: 2019-06-29 | Disposition: A | Discharge status: Home / Self Care | Payer: Self-pay | Attending: Otolaryngology | Admitting: Otolaryngology

## 2019-06-29 ENCOUNTER — Encounter (HOSPITAL_BASED_OUTPATIENT_CLINIC_OR_DEPARTMENT_OTHER)
Admission: RE | Disposition: A | Discharge status: Home / Self Care | Payer: Self-pay | Source: Home / Self Care | Attending: Otolaryngology

## 2019-06-29 ENCOUNTER — Ambulatory Visit (HOSPITAL_BASED_OUTPATIENT_CLINIC_OR_DEPARTMENT_OTHER): Payer: Self-pay | Admitting: Anesthesiology

## 2019-06-29 ENCOUNTER — Encounter (HOSPITAL_BASED_OUTPATIENT_CLINIC_OR_DEPARTMENT_OTHER): Payer: Self-pay | Admitting: Otolaryngology

## 2019-06-29 DIAGNOSIS — F17200 Nicotine dependence, unspecified, uncomplicated: Secondary | ICD-10-CM | POA: Insufficient documentation

## 2019-06-29 DIAGNOSIS — Z8781 Personal history of (healed) traumatic fracture: Secondary | ICD-10-CM | POA: Insufficient documentation

## 2019-06-29 DIAGNOSIS — Z472 Encounter for removal of internal fixation device: Secondary | ICD-10-CM | POA: Insufficient documentation

## 2019-06-29 HISTORY — PX: MANDIBULAR HARDWARE REMOVAL: SHX5205

## 2019-06-29 SURGERY — REMOVAL, HARDWARE, MANDIBLE
Anesthesia: Monitor Anesthesia Care | Site: Mouth | Laterality: Bilateral

## 2019-06-29 MED ORDER — FENTANYL CITRATE (PF) 100 MCG/2ML IJ SOLN
INTRAMUSCULAR | Status: AC
  Filled 2019-06-29: qty 2

## 2019-06-29 MED ORDER — MIDAZOLAM HCL 2 MG/2ML IJ SOLN
INTRAMUSCULAR | Status: AC
  Filled 2019-06-29: qty 2

## 2019-06-29 MED ORDER — FENTANYL CITRATE (PF) 100 MCG/2ML IJ SOLN
INTRAMUSCULAR | Status: DC | PRN
  Administered 2019-06-29 (×2): 50 ug via INTRAVENOUS

## 2019-06-29 MED ORDER — EPHEDRINE 5 MG/ML INJ
INTRAVENOUS | Status: AC
  Filled 2019-06-29: qty 10

## 2019-06-29 MED ORDER — LIDOCAINE-EPINEPHRINE 1 %-1:100000 IJ SOLN
INTRAMUSCULAR | Status: DC | PRN
  Administered 2019-06-29: 1 mL

## 2019-06-29 MED ORDER — OXYCODONE HCL 5 MG/5ML PO SOLN: 5.0000 mg | Freq: Once | ORAL | Status: DC | PRN

## 2019-06-29 MED ORDER — OXYCODONE HCL 5 MG PO TABS: 5.0000 mg | ORAL_TABLET | Freq: Once | ORAL | Status: DC | PRN

## 2019-06-29 MED ORDER — ACETAMINOPHEN 160 MG/5ML PO SOLN: 325.0000 mg | ORAL | Status: DC | PRN

## 2019-06-29 MED ORDER — FENTANYL CITRATE (PF) 100 MCG/2ML IJ SOLN: 25.0000 ug | INTRAMUSCULAR | Status: DC | PRN

## 2019-06-29 MED ORDER — MEPERIDINE HCL 25 MG/ML IJ SOLN: 6.2500 mg | INTRAMUSCULAR | Status: DC | PRN

## 2019-06-29 MED ORDER — ACETAMINOPHEN 325 MG PO TABS: 325.0000 mg | ORAL_TABLET | ORAL | Status: DC | PRN

## 2019-06-29 MED ORDER — LIDOCAINE 2% (20 MG/ML) 5 ML SYRINGE
INTRAMUSCULAR | Status: DC | PRN
  Administered 2019-06-29: 60 mg via INTRAVENOUS

## 2019-06-29 MED ORDER — PROPOFOL 10 MG/ML IV BOLUS
INTRAVENOUS | Status: DC | PRN
  Administered 2019-06-29 (×3): 20 ug via INTRAVENOUS
  Administered 2019-06-29: 10 ug via INTRAVENOUS
  Administered 2019-06-29: 20 ug via INTRAVENOUS

## 2019-06-29 MED ORDER — ONDANSETRON HCL 4 MG/2ML IJ SOLN: 4.0000 mg | Freq: Once | INTRAMUSCULAR | Status: DC | PRN

## 2019-06-29 MED ORDER — MIDAZOLAM HCL 5 MG/5ML IJ SOLN
INTRAMUSCULAR | Status: DC | PRN
  Administered 2019-06-29: 2 mg via INTRAVENOUS

## 2019-06-29 MED ORDER — LACTATED RINGERS IV SOLN: INTRAVENOUS | Status: DC

## 2019-06-29 SURGICAL SUPPLY — 29 items
BLADE SURG 15 STRL LF DISP TIS (BLADE) ×1 IMPLANT
BLADE SURG 15 STRL SS (BLADE) ×2
CANISTER SUCT 1200ML W/VALVE (MISCELLANEOUS) ×3 IMPLANT
COVER MAYO STAND STRL (DRAPES) ×3 IMPLANT
COVER WAND RF STERILE (DRAPES) IMPLANT
DECANTER SPIKE VIAL GLASS SM (MISCELLANEOUS) IMPLANT
DRAPE HALF SHEET 70X43 (DRAPES) ×3 IMPLANT
GAUZE 4X4 16PLY RFD (DISPOSABLE) IMPLANT
GAUZE SPONGE 4X4 12PLY STRL LF (GAUZE/BANDAGES/DRESSINGS) IMPLANT
GLOVE BIO SURGEON STRL SZ7.5 (GLOVE) IMPLANT
GLOVE BIOGEL PI IND STRL 7.0 (GLOVE) ×1 IMPLANT
GLOVE BIOGEL PI INDICATOR 7.0 (GLOVE) ×2
GLOVE ECLIPSE 6.5 STRL STRAW (GLOVE) ×3 IMPLANT
GOWN STRL REUS W/ TWL LRG LVL3 (GOWN DISPOSABLE) ×1 IMPLANT
GOWN STRL REUS W/TWL LRG LVL3 (GOWN DISPOSABLE) ×2
MARKER SKIN DUAL TIP RULER LAB (MISCELLANEOUS) IMPLANT
NEEDLE PRECISIONGLIDE 27X1.5 (NEEDLE) ×3 IMPLANT
NS IRRIG 1000ML POUR BTL (IV SOLUTION) IMPLANT
PACK BASIN DAY SURGERY FS (CUSTOM PROCEDURE TRAY) ×3 IMPLANT
SCISSORS WIRE ANG 4 3/4 DISP (INSTRUMENTS) ×3 IMPLANT
SUT CHROMIC 3 0 PS 2 (SUTURE) IMPLANT
SUT CHROMIC 4 0 PS 2 18 (SUTURE) IMPLANT
SUT CHROMIC 4 0 RB 1X27 (SUTURE) IMPLANT
SYR CONTROL 10ML LL (SYRINGE) ×3 IMPLANT
TOWEL GREEN STERILE FF (TOWEL DISPOSABLE) ×3 IMPLANT
TRAY DSU PREP LF (CUSTOM PROCEDURE TRAY) IMPLANT
TUBE CONNECTING 20'X1/4 (TUBING) ×1
TUBE CONNECTING 20X1/4 (TUBING) ×2 IMPLANT
YANKAUER SUCT BULB TIP NO VENT (SUCTIONS) IMPLANT
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
## 2019-06-29 NOTE — Anesthesia Preprocedure Evaluation (Signed)
Anesthesia Evaluation  Patient identified by MRN, date of birth, ID band Patient awake    Reviewed: Allergy & Precautions, H&P , NPO status , Patient's Chart, lab work & pertinent test results, reviewed documented beta blocker date and time   History of Anesthesia Complications Negative for: history of anesthetic complications  Airway   TM Distance: >3 FB Neck ROM: Full  Mouth opening: Limited Mouth Opening  Dental no notable dental hx.    Pulmonary neg pulmonary ROS, neg recent URI, Current Smoker and Patient abstained from smoking.,    Pulmonary exam normal breath sounds clear to auscultation       Cardiovascular Exercise Tolerance: Good negative cardio ROS   Rhythm:Regular Rate:Normal     Neuro/Psych negative neurological ROS  negative psych ROS   GI/Hepatic negative GI ROS, Neg liver ROS, (+)     substance abuse  marijuana use,   Endo/Other  negative endocrine ROS  Renal/GU negative Renal ROS  negative genitourinary   Musculoskeletal negative musculoskeletal ROS (+)   Abdominal   Peds  Hematology negative hematology ROS (+)   Anesthesia Other Findings   Reproductive/Obstetrics negative OB ROS                             Anesthesia Physical  Anesthesia Plan  ASA: II  Anesthesia Plan: MAC   Post-op Pain Management:    Induction: Intravenous  PONV Risk Score and Plan: 1 and Treatment may vary due to age or medical condition  Airway Management Planned: Nasal ETT  Additional Equipment: None  Intra-op Plan:   Post-operative Plan: Extubation in OR  Informed Consent: I have reviewed the patients History and Physical, chart, labs and discussed the procedure including the risks, benefits and alternatives for the proposed anesthesia with the patient or authorized representative who has indicated his/her understanding and acceptance.     Dental advisory given  Plan  Discussed with: CRNA, Surgeon and Anesthesiologist  Anesthesia Plan Comments:         Anesthesia Quick Evaluation

## 2019-06-29 NOTE — Transfer of Care (Signed)
Immediate Anesthesia Transfer of Care Note  Patient: Joseph Villanueva  Procedure(s) Performed: MANDIBULAR HARDWARE REMOVAL (Bilateral Mouth)  Patient Location: PACU  Anesthesia Type:MAC  Level of Consciousness: awake, alert  and oriented  Airway & Oxygen Therapy: Patient Spontanous Breathing  Post-op Assessment: Report given to RN and Post -op Vital signs reviewed and stable  Post vital signs: Reviewed and stable  Last Vitals:  Vitals Value Taken Time  BP 143/97 06/29/19 1007  Temp    Pulse 62 06/29/19 1008  Resp 18 06/29/19 1008  SpO2 99 % 06/29/19 1008  Vitals shown include unvalidated device data.  Last Pain:  Vitals:   06/29/19 0832  TempSrc: Oral  PainSc: 0-No pain         Complications: No apparent anesthesia complications

## 2019-06-29 NOTE — Discharge Instructions (Addendum)
The patient may resume all his regular activities and diet.    Post Anesthesia Home Care Instructions  Activity: Get plenty of rest for the remainder of the day. A responsible individual must stay with you for 24 hours following the procedure.  For the next 24 hours, DO NOT: -Drive a car -Advertising copywriter -Drink alcoholic beverages -Take any medication unless instructed by your physician -Make any legal decisions or sign important papers.  Meals: Start with liquid foods such as gelatin or soup. Progress to regular foods as tolerated. Avoid greasy, spicy, heavy foods. If nausea and/or vomiting occur, drink only clear liquids until the nausea and/or vomiting subsides. Call your physician if vomiting continues.  Special Instructions/Symptoms: Your throat may feel dry or sore from the anesthesia or the breathing tube placed in your throat during surgery. If this causes discomfort, gargle with warm salt water. The discomfort should disappear within 24 hours.  If you had a scopolamine patch placed behind your ear for the management of post- operative nausea and/or vomiting:  1. The medication in the patch is effective for 72 hours, after which it should be removed.  Wrap patch in a tissue and discard in the trash. Wash hands thoroughly with soap and water. 2. You may remove the patch earlier than 72 hours if you experience unpleasant side effects which may include dry mouth, dizziness or visual disturbances. 3. Avoid touching the patch. Wash your hands with soap and water after contact with the patch.

## 2019-06-30 ENCOUNTER — Encounter: Payer: Self-pay | Admitting: *Deleted

## 2019-06-30 NOTE — Anesthesia Postprocedure Evaluation (Signed)
Anesthesia Post Note  Patient: Joseph Villanueva  Procedure(s) Performed: MANDIBULAR HARDWARE REMOVAL (Bilateral Mouth)     Patient location during evaluation: PACU Anesthesia Type: MAC Level of consciousness: awake and alert Pain management: pain level controlled Vital Signs Assessment: post-procedure vital signs reviewed and stable Respiratory status: spontaneous breathing, nonlabored ventilation, respiratory function stable and patient connected to nasal cannula oxygen Cardiovascular status: stable and blood pressure returned to baseline Postop Assessment: no apparent nausea or vomiting Anesthetic complications: no    Last Vitals:  Vitals:   06/29/19 1030 06/29/19 1045  BP: (!) 126/96 133/87  Pulse: (!) 58 64  Resp: 14 20  Temp:  37.2 C  SpO2: 100% 100%    Last Pain:  Vitals:   06/29/19 1045  TempSrc: Oral  PainSc: 0-No pain                 Garron Eline

## 2019-07-27 ENCOUNTER — Emergency Department (HOSPITAL_COMMUNITY): Payer: Self-pay | Admitting: Certified Registered"

## 2019-07-27 ENCOUNTER — Other Ambulatory Visit: Payer: Self-pay

## 2019-07-27 ENCOUNTER — Emergency Department (HOSPITAL_COMMUNITY): Payer: Self-pay

## 2019-07-27 ENCOUNTER — Ambulatory Visit (HOSPITAL_COMMUNITY)
Admission: EM | Admit: 2019-07-27 | Discharge: 2019-07-27 | Disposition: A | Discharge status: Home / Self Care | Payer: Self-pay | Attending: Orthopedic Surgery | Admitting: Orthopedic Surgery

## 2019-07-27 ENCOUNTER — Encounter (HOSPITAL_COMMUNITY): Payer: Self-pay | Admitting: Emergency Medicine

## 2019-07-27 ENCOUNTER — Encounter (HOSPITAL_COMMUNITY)
Admission: EM | Disposition: A | Discharge status: Home / Self Care | Payer: Self-pay | Source: Home / Self Care | Attending: Emergency Medicine

## 2019-07-27 DIAGNOSIS — M2550 Pain in unspecified joint: Secondary | ICD-10-CM | POA: Diagnosis not present

## 2019-07-27 DIAGNOSIS — S42009A Fracture of unspecified part of unspecified clavicle, initial encounter for closed fracture: Secondary | ICD-10-CM

## 2019-07-27 DIAGNOSIS — Y9355 Activity, bike riding: Secondary | ICD-10-CM | POA: Insufficient documentation

## 2019-07-27 DIAGNOSIS — R079 Chest pain, unspecified: Secondary | ICD-10-CM | POA: Diagnosis not present

## 2019-07-27 DIAGNOSIS — S42021A Displaced fracture of shaft of right clavicle, initial encounter for closed fracture: Secondary | ICD-10-CM | POA: Diagnosis present

## 2019-07-27 DIAGNOSIS — Y92410 Unspecified street and highway as the place of occurrence of the external cause: Secondary | ICD-10-CM | POA: Insufficient documentation

## 2019-07-27 DIAGNOSIS — Z20822 Contact with and (suspected) exposure to covid-19: Secondary | ICD-10-CM | POA: Insufficient documentation

## 2019-07-27 DIAGNOSIS — Z419 Encounter for procedure for purposes other than remedying health state, unspecified: Secondary | ICD-10-CM

## 2019-07-27 DIAGNOSIS — T148XXA Other injury of unspecified body region, initial encounter: Secondary | ICD-10-CM

## 2019-07-27 HISTORY — PX: ORIF CLAVICULAR FRACTURE: SHX5055

## 2019-07-27 LAB — RESPIRATORY PANEL BY RT PCR (FLU A&B, COVID)
Influenza A by PCR: NEGATIVE
Influenza B by PCR: NEGATIVE
SARS Coronavirus 2 by RT PCR: NEGATIVE

## 2019-07-27 SURGERY — OPEN REDUCTION INTERNAL FIXATION (ORIF) CLAVICULAR FRACTURE
Anesthesia: General | Laterality: Right

## 2019-07-27 MED ORDER — CEFAZOLIN SODIUM-DEXTROSE 2-4 GM/100ML-% IV SOLN
2.0000 g | INTRAVENOUS | Status: AC
  Administered 2019-07-27: 2 g via INTRAVENOUS

## 2019-07-27 MED ORDER — OXYCODONE-ACETAMINOPHEN 5-325 MG PO TABS: 1.0000 | ORAL_TABLET | Freq: Four times a day (QID) | ORAL | 0 refills | Status: DC | PRN

## 2019-07-27 MED ORDER — LIDOCAINE 2% (20 MG/ML) 5 ML SYRINGE
INTRAMUSCULAR | Status: AC
  Filled 2019-07-27: qty 5

## 2019-07-27 MED ORDER — MIDAZOLAM HCL 2 MG/2ML IJ SOLN
INTRAMUSCULAR | Status: AC
  Filled 2019-07-27: qty 2

## 2019-07-27 MED ORDER — MIDAZOLAM HCL 2 MG/2ML IJ SOLN
INTRAMUSCULAR | Status: DC | PRN
  Administered 2019-07-27: 2 mg via INTRAVENOUS

## 2019-07-27 MED ORDER — DEXAMETHASONE SODIUM PHOSPHATE 10 MG/ML IJ SOLN
INTRAMUSCULAR | Status: DC | PRN
  Administered 2019-07-27: 10 mg via INTRAVENOUS

## 2019-07-27 MED ORDER — SUCCINYLCHOLINE CHLORIDE 20 MG/ML IJ SOLN
INTRAMUSCULAR | Status: DC | PRN
  Administered 2019-07-27: 120 mg via INTRAVENOUS

## 2019-07-27 MED ORDER — ROCURONIUM BROMIDE 10 MG/ML (PF) SYRINGE
PREFILLED_SYRINGE | INTRAVENOUS | Status: DC | PRN
  Administered 2019-07-27: 10 mg via INTRAVENOUS
  Administered 2019-07-27: 60 mg via INTRAVENOUS

## 2019-07-27 MED ORDER — METHOCARBAMOL 500 MG PO TABS: 500.0000 mg | ORAL_TABLET | Freq: Four times a day (QID) | ORAL | 0 refills | Status: DC | PRN

## 2019-07-27 MED ORDER — PROPOFOL 10 MG/ML IV BOLUS
INTRAVENOUS | Status: AC
  Filled 2019-07-27: qty 20

## 2019-07-27 MED ORDER — CHLORHEXIDINE GLUCONATE 4 % EX LIQD: 60.0000 mL | Freq: Once | CUTANEOUS | Status: DC

## 2019-07-27 MED ORDER — LIDOCAINE 2% (20 MG/ML) 5 ML SYRINGE
INTRAMUSCULAR | Status: DC | PRN
  Administered 2019-07-27: 60 mg via INTRAVENOUS

## 2019-07-27 MED ORDER — ONDANSETRON HCL 4 MG/2ML IJ SOLN
INTRAMUSCULAR | Status: AC
  Filled 2019-07-27: qty 2

## 2019-07-27 MED ORDER — POVIDONE-IODINE 10 % EX SWAB: 2.0000 "application " | Freq: Once | CUTANEOUS | Status: DC

## 2019-07-27 MED ORDER — BUPIVACAINE LIPOSOME 1.3 % IJ SUSP
20.0000 mL | Freq: Once | INTRAMUSCULAR | Status: DC
  Filled 2019-07-27: qty 20

## 2019-07-27 MED ORDER — FENTANYL CITRATE (PF) 100 MCG/2ML IJ SOLN
INTRAMUSCULAR | Status: DC | PRN
  Administered 2019-07-27 (×5): 50 ug via INTRAVENOUS

## 2019-07-27 MED ORDER — DEXAMETHASONE SODIUM PHOSPHATE 10 MG/ML IJ SOLN
INTRAMUSCULAR | Status: AC
  Filled 2019-07-27: qty 1

## 2019-07-27 MED ORDER — MORPHINE SULFATE (PF) 4 MG/ML IV SOLN
6.0000 mg | Freq: Once | INTRAVENOUS | Status: AC
  Administered 2019-07-27: 6 mg via INTRAVENOUS
  Filled 2019-07-27: qty 2

## 2019-07-27 MED ORDER — ACETAMINOPHEN 500 MG PO TABS: 1000.0000 mg | ORAL_TABLET | Freq: Once | ORAL | Status: AC

## 2019-07-27 MED ORDER — CEFAZOLIN SODIUM-DEXTROSE 2-4 GM/100ML-% IV SOLN
INTRAVENOUS | Status: AC
  Filled 2019-07-27: qty 100

## 2019-07-27 MED ORDER — 0.9 % SODIUM CHLORIDE (POUR BTL) OPTIME
TOPICAL | Status: DC | PRN
  Administered 2019-07-27: 17:00:00 1000 mL

## 2019-07-27 MED ORDER — FENTANYL CITRATE (PF) 100 MCG/2ML IJ SOLN: 25.0000 ug | INTRAMUSCULAR | Status: DC | PRN

## 2019-07-27 MED ORDER — ACETAMINOPHEN 500 MG PO TABS
ORAL_TABLET | ORAL | Status: AC
  Administered 2019-07-27: 1000 mg via ORAL
  Filled 2019-07-27: qty 2

## 2019-07-27 MED ORDER — ROCURONIUM BROMIDE 10 MG/ML (PF) SYRINGE
PREFILLED_SYRINGE | INTRAVENOUS | Status: AC
  Filled 2019-07-27: qty 10

## 2019-07-27 MED ORDER — SODIUM CHLORIDE (PF) 0.9 % IJ SOLN
INTRAMUSCULAR | Status: DC | PRN
  Administered 2019-07-27: 20 mL

## 2019-07-27 MED ORDER — LACTATED RINGERS IV SOLN: INTRAVENOUS | Status: DC | PRN

## 2019-07-27 MED ORDER — FENTANYL CITRATE (PF) 250 MCG/5ML IJ SOLN
INTRAMUSCULAR | Status: AC
  Filled 2019-07-27: qty 5

## 2019-07-27 MED ORDER — ONDANSETRON HCL 4 MG/2ML IJ SOLN
INTRAMUSCULAR | Status: DC | PRN
  Administered 2019-07-27: 4 mg via INTRAVENOUS

## 2019-07-27 MED ORDER — PROPOFOL 10 MG/ML IV BOLUS
INTRAVENOUS | Status: DC | PRN
  Administered 2019-07-27: 200 mg via INTRAVENOUS

## 2019-07-27 MED ORDER — BUPIVACAINE-EPINEPHRINE 0.5% -1:200000 IJ SOLN
INTRAMUSCULAR | Status: DC | PRN
  Administered 2019-07-27: 20 mL

## 2019-07-27 MED ORDER — ONDANSETRON 4 MG PO TBDP: 4.0000 mg | ORAL_TABLET | Freq: Three times a day (TID) | ORAL | 0 refills | Status: DC | PRN

## 2019-07-27 MED ORDER — SUGAMMADEX SODIUM 200 MG/2ML IV SOLN
INTRAVENOUS | Status: DC | PRN
  Administered 2019-07-27: 200 mg via INTRAVENOUS

## 2019-07-27 SURGICAL SUPPLY — 59 items
BENZOIN TINCTURE PRP APPL 2/3 (GAUZE/BANDAGES/DRESSINGS) ×3 IMPLANT
BIT DRILL 2.8X5 QR DISP (BIT) ×3 IMPLANT
BNDG COHESIVE 4X5 TAN STRL (GAUZE/BANDAGES/DRESSINGS) IMPLANT
BRUSH SCRUB EZ PLAIN DRY (MISCELLANEOUS) ×3 IMPLANT
CLOSURE WOUND 1/2 X4 (GAUZE/BANDAGES/DRESSINGS) ×1
COVER SURGICAL LIGHT HANDLE (MISCELLANEOUS) ×6 IMPLANT
COVER WAND RF STERILE (DRAPES) IMPLANT
DRAPE C-ARM 42X72 X-RAY (DRAPES) ×3 IMPLANT
DRAPE INCISE IOBAN 66X45 STRL (DRAPES) ×3 IMPLANT
DRAPE LAPAROTOMY TRNSV 102X78 (DRAPES) IMPLANT
DRAPE ORTHO SPLIT 77X108 STRL (DRAPES) ×4
DRAPE SURG ORHT 6 SPLT 77X108 (DRAPES) ×2 IMPLANT
DRAPE U-SHAPE 47X51 STRL (DRAPES) ×3 IMPLANT
DRSG MEPILEX BORDER 4X8 (GAUZE/BANDAGES/DRESSINGS) ×3 IMPLANT
ELECT REM PT RETURN 9FT ADLT (ELECTROSURGICAL) ×3
ELECTRODE REM PT RTRN 9FT ADLT (ELECTROSURGICAL) ×1 IMPLANT
GLOVE BIO SURGEON STRL SZ7.5 (GLOVE) ×3 IMPLANT
GLOVE BIO SURGEON STRL SZ8 (GLOVE) ×3 IMPLANT
GLOVE BIOGEL PI IND STRL 7.5 (GLOVE) ×1 IMPLANT
GLOVE BIOGEL PI IND STRL 8 (GLOVE) ×1 IMPLANT
GLOVE BIOGEL PI INDICATOR 7.5 (GLOVE) ×2
GLOVE BIOGEL PI INDICATOR 8 (GLOVE) ×2
GOWN STRL REUS W/ TWL LRG LVL3 (GOWN DISPOSABLE) ×2 IMPLANT
GOWN STRL REUS W/ TWL XL LVL3 (GOWN DISPOSABLE) ×1 IMPLANT
GOWN STRL REUS W/TWL LRG LVL3 (GOWN DISPOSABLE) ×4
GOWN STRL REUS W/TWL XL LVL3 (GOWN DISPOSABLE) ×2
KIT BASIN OR (CUSTOM PROCEDURE TRAY) ×3 IMPLANT
KIT TURNOVER KIT B (KITS) ×3 IMPLANT
MANIFOLD NEPTUNE II (INSTRUMENTS) IMPLANT
NEEDLE HYPO 25GX1X1/2 BEV (NEEDLE) ×3 IMPLANT
NS IRRIG 1000ML POUR BTL (IV SOLUTION) ×3 IMPLANT
PACK GENERAL/GYN (CUSTOM PROCEDURE TRAY) ×3 IMPLANT
PAD ARMBOARD 7.5X6 YLW CONV (MISCELLANEOUS) ×6 IMPLANT
PLATE CLAVICLE 8 HOLE RT (Plate) ×3 IMPLANT
PLATE LOCKING CLAVICLE 10 HOLE (Plate) ×3 IMPLANT
SCREW HEXALOBE LOCKING 3.5X14M (Screw) ×6 IMPLANT
SCREW HEXALOBE NON-LOCK 3.5X14 (Screw) ×3 IMPLANT
SCREW HEXALOBE NON-LOCK 3.5X16 (Screw) ×3 IMPLANT
SCREW LOCK 12X3.5X HEXALOBE (Screw) ×1 IMPLANT
SCREW LOCKING 3.5X12 (Screw) ×2 IMPLANT
SCREW NONLOCK HEX 3.5X12 (Screw) ×3 IMPLANT
SLING ARM FOAM STRAP LRG (SOFTGOODS) ×3 IMPLANT
SLING ARM IMMOBILIZER LRG (SOFTGOODS) IMPLANT
SLING ARM IMMOBILIZER MED (SOFTGOODS) IMPLANT
STAPLER VISISTAT 35W (STAPLE) ×3 IMPLANT
STOCKINETTE IMPERVIOUS 9X36 MD (GAUZE/BANDAGES/DRESSINGS) IMPLANT
STRIP CLOSURE SKIN 1/2X4 (GAUZE/BANDAGES/DRESSINGS) ×2 IMPLANT
SUCTION FRAZIER HANDLE 10FR (MISCELLANEOUS) ×2
SUCTION TUBE FRAZIER 10FR DISP (MISCELLANEOUS) ×1 IMPLANT
SUT MNCRL AB 3-0 PS2 27 (SUTURE) ×3 IMPLANT
SUT VIC AB 0 CT1 27 (SUTURE) ×2
SUT VIC AB 0 CT1 27XBRD ANBCTR (SUTURE) ×1 IMPLANT
SUT VIC AB 1 CT1 36 (SUTURE) ×3 IMPLANT
SUT VIC AB 2-0 CT1 27 (SUTURE) ×2
SUT VIC AB 2-0 CT1 TAPERPNT 27 (SUTURE) ×1 IMPLANT
SYR CONTROL 10ML LL (SYRINGE) IMPLANT
TOWEL GREEN STERILE (TOWEL DISPOSABLE) ×3 IMPLANT
TOWEL GREEN STERILE FF (TOWEL DISPOSABLE) ×3 IMPLANT
WATER STERILE IRR 1000ML POUR (IV SOLUTION) IMPLANT

## 2019-07-27 NOTE — Discharge Instructions (Signed)
Orthopaedic Trauma Service Discharge Instructions   General Discharge Instructions  Orthopaedic Injuries:  Right Clavicle Fracture treated with open reduction and internal fixation using a plate and screws   WEIGHT BEARING STATUS: no lifting greater than 5 lbs with Right arm  RANGE OF MOTION/ACTIVITY: No range of motion restrictions. Ok to move shoulder, elbow, forearm, wrist and hand.  Sling is for comfort only. If you do wear your sling be sure to come out of it several times a day to move your elbow. Otherwise your elbow will get stiff    Wound Care: daily wound care starting on 07/30/2019. Ok to remove beige dressing on 07/30/2019.  Keep the white strips on your skin. Let them fall off on their own.  Once the wound is dry it is ok to clean wound with soap and water.  It is ok to keep incision open to the air once there is no drainage.  If you would like to cover the incision you can find a dressing similar to what you had a the hospital at a medical supply store such as [facility] (dressing is known as a mepilex dressing) or you can use 4x4 gauze and tape.     Diet: as you were eating previously.  Can use over the counter stool softeners and bowel preparations, such as Miralax, to help with bowel movements.  Narcotics can be constipating.  Be sure to drink plenty of fluids  PAIN MEDICATION USE AND EXPECTATIONS  You have likely been given narcotic medications to help control your pain.  After a traumatic event that results in an fracture (broken bone) with or without surgery, it is ok to use narcotic pain medications to help control one's pain.  We understand that everyone responds to pain differently and each individual patient will be evaluated on a regular basis for the continued need for narcotic medications. Ideally, narcotic medication use should last no more than 6-8 weeks (coinciding with fracture healing).   As a patient it is your responsibility as well to monitor narcotic  medication use and report the amount and frequency you use these medications when you come to your office visit.   We would also advise that if you are using narcotic medications, you should take a dose prior to therapy to maximize you participation.  IF YOU ARE ON NARCOTIC MEDICATIONS IT IS NOT PERMISSIBLE TO OPERATE A MOTOR VEHICLE (MOTORCYCLE/CAR/TRUCK/MOPED) OR HEAVY MACHINERY DO NOT MIX NARCOTICS WITH OTHER CNS (CENTRAL NERVOUS SYSTEM) DEPRESSANTS SUCH AS ALCOHOL   STOP SMOKING OR USING NICOTINE PRODUCTS!!!!  As discussed nicotine severely impairs your body's ability to heal surgical and traumatic wounds but also impairs bone healing.  Wounds and bone heal by forming microscopic blood vessels (angiogenesis) and nicotine is a vasoconstrictor (essentially, shrinks blood vessels).  Therefore, if vasoconstriction occurs to these microscopic blood vessels they essentially disappear and are unable to deliver necessary nutrients to the healing tissue.  This is one modifiable factor that you can do to dramatically increase your chances of healing your injury.    (This means no smoking, no nicotine gum, patches, etc)  DO NOT USE NONSTEROIDAL ANTI-INFLAMMATORY DRUGS (NSAID'S)  Using products such as Advil (ibuprofen), Aleve (naproxen), Motrin (ibuprofen) for additional pain control during fracture healing can delay and/or prevent the healing response.  If you would like to take over the counter (OTC) medication, Tylenol (acetaminophen) is ok.  However, some narcotic medications that are given for pain control contain acetaminophen as well. Therefore, you  should not exceed more than 4000 mg of tylenol in a day if you do not have liver disease.  Also note that there are may OTC medicines, such as cold medicines and allergy medicines that my contain tylenol as well.  If you have any questions about medications and/or interactions please ask your doctor/PA or your pharmacist.      ICE AND ELEVATE  INJURED/OPERATIVE EXTREMITY  Using ice and elevating the injured extremity above your heart can help with swelling and pain control.  Icing in a pulsatile fashion, such as 20 minutes on and 20 minutes off, can be followed.    Do not place ice directly on skin. Make sure there is a barrier between to skin and the ice pack.    Using frozen items such as frozen peas works well as the conform nicely to the are that needs to be iced.  USE AN ACE WRAP OR TED HOSE FOR SWELLING CONTROL  In addition to icing and elevation, Ace wraps or TED hose are used to help limit and resolve swelling.  It is recommended to use Ace wraps or TED hose until you are informed to stop.    When using Ace Wraps start the wrapping distally (farthest away from the body) and wrap proximally (closer to the body)   Example: If you had surgery on your leg or thing and you do not have a splint on, start the ace wrap at the toes and work your way up to the thigh        If you had surgery on your upper extremity and do not have a splint on, start the ace wrap at your fingers and work your way up to the upper arm  IF YOU ARE IN A SPLINT OR CAST DO NOT REMOVE IT FOR ANY REASON   If your splint gets wet for any reason please contact the office immediately. You may shower in your splint or cast as long as you keep it dry.  This can be done by wrapping in a cast cover or garbage back (or similar)  Do Not stick any thing down your splint or cast such as pencils, money, or hangers to try and scratch yourself with.  If you feel itchy take benadryl as prescribed on the bottle for itching  IF YOU ARE IN A CAM BOOT (BLACK BOOT)  You may remove boot periodically. Perform daily dressing changes as noted below.  Wash the liner of the boot regularly and wear a sock when wearing the boot. It is recommended that you sleep in the boot until told otherwise    Call office for the following:  Temperature greater than 101F  Persistent nausea and  vomiting  Severe uncontrolled pain  Redness, tenderness, or signs of infection (pain, swelling, redness, odor or green/yellow discharge around the site)  Difficulty breathing, headache or visual disturbances  Hives  Persistent dizziness or light-headedness  Extreme fatigue  Any other questions or concerns you may have after discharge  In an emergency, call 911 or go to an Emergency Department at a nearby hospital    CALL THE OFFICE WITH ANY QUESTIONS OR CONCERNS: [phone]   VISIT OUR WEBSITE FOR ADDITIONAL INFORMATION: [email or web address]

## 2019-07-27 NOTE — Transfer of Care (Signed)
Immediate Anesthesia Transfer of Care Note  Patient: Joseph Villanueva  Procedure(s) Performed: OPEN REDUCTION INTERNAL FIXATION (ORIF) CLAVICULAR FRACTURE (Right )  Patient Location: PACU  Anesthesia Type:General  Level of Consciousness: drowsy and patient cooperative  Airway & Oxygen Therapy: Patient Spontanous Breathing  Post-op Assessment: Report given to RN  Post vital signs: Reviewed and stable  Last Vitals:  Vitals Value Taken Time  BP 148/68 07/27/19 1840  Temp 36.5 C 07/27/19 1840  Pulse 85 07/27/19 1840  Resp 16 07/27/19 1840  SpO2 99 % 07/27/19 1840  Vitals shown include unvalidated device data.  Last Pain:  Vitals:   07/27/19 1307  TempSrc: Oral  PainSc:       Patients Stated Pain Goal: 0 (07/27/19 1305)  Complications: No apparent anesthesia complications
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
## 2019-07-27 NOTE — Anesthesia Postprocedure Evaluation (Signed)
Anesthesia Post Note  Patient: Joseph Villanueva  Procedure(s) Performed: OPEN REDUCTION INTERNAL FIXATION (ORIF) CLAVICULAR FRACTURE (Right )     Patient location during evaluation: PACU Anesthesia Type: General Level of consciousness: awake and alert Pain management: pain level controlled Vital Signs Assessment: post-procedure vital signs reviewed and stable Respiratory status: spontaneous breathing, nonlabored ventilation, respiratory function stable and patient connected to nasal cannula oxygen Cardiovascular status: blood pressure returned to baseline and stable Postop Assessment: no apparent nausea or vomiting Anesthetic complications: no    Last Vitals:  Vitals:   07/27/19 1840 07/27/19 1855  BP: (!) 148/68 132/66  Pulse: 88 75  Resp: 14 13  Temp: 36.5 C 36.6 C  SpO2: 97% 96%    Last Pain:  Vitals:   07/27/19 1840  TempSrc:   PainSc: Asleep                 [name] COKER
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
## 2019-07-27 NOTE — Anesthesia Procedure Notes (Signed)
Procedure Name: Intubation Date/Time: 07/27/2019 4:32 PM Performed by: Barrington Ellison, CRNA Pre-anesthesia Checklist: Patient identified, Emergency Drugs available, Suction available and Patient being monitored Patient Re-evaluated:Patient Re-evaluated prior to induction Oxygen Delivery Method: Circle System Utilized Preoxygenation: Pre-oxygenation with 100% oxygen Induction Type: IV induction Ventilation: Mask ventilation without difficulty Laryngoscope Size: Mac and 3 Grade View: Grade I Tube type: Oral Tube size: 7.5 mm Number of attempts: 1 Airway Equipment and Method: Stylet and Oral airway Placement Confirmation: ETT inserted through vocal cords under direct vision,  positive ETCO2 and breath sounds checked- equal and bilateral Secured at: 24 cm Tube secured with: Tape Dental Injury: Teeth and Oropharynx as per pre-operative assessment
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
## 2019-07-28 ENCOUNTER — Encounter: Payer: Self-pay | Admitting: *Deleted
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
## 2020-01-06 ENCOUNTER — Encounter (HOSPITAL_COMMUNITY): Payer: Self-pay

## 2020-01-06 ENCOUNTER — Other Ambulatory Visit: Payer: Self-pay

## 2020-01-06 ENCOUNTER — Ambulatory Visit (HOSPITAL_COMMUNITY)
Admission: EM | Admit: 2020-01-06 | Discharge: 2020-01-06 | Disposition: A | Discharge status: Home / Self Care | Payer: Self-pay | Attending: Physician Assistant | Admitting: Physician Assistant

## 2020-01-06 DIAGNOSIS — N451 Epididymitis: Secondary | ICD-10-CM | POA: Insufficient documentation

## 2020-01-06 MED ORDER — CEFTRIAXONE SODIUM 500 MG IJ SOLR
500.0000 mg | Freq: Once | INTRAMUSCULAR | Status: AC
  Administered 2020-01-06: 500 mg via INTRAMUSCULAR

## 2020-01-06 MED ORDER — DOXYCYCLINE HYCLATE 100 MG PO CAPS: 100.0000 mg | ORAL_CAPSULE | Freq: Two times a day (BID) | ORAL | 0 refills | Status: DC

## 2020-01-06 MED ORDER — LIDOCAINE HCL (PF) 1 % IJ SOLN
INTRAMUSCULAR | Status: AC
  Filled 2020-01-06: qty 2

## 2020-01-06 MED ORDER — IBUPROFEN 800 MG PO TABS: 800.0000 mg | ORAL_TABLET | Freq: Three times a day (TID) | ORAL | 0 refills | Status: AC

## 2020-01-06 MED ORDER — CEFTRIAXONE SODIUM 500 MG IJ SOLR
INTRAMUSCULAR | Status: AC
  Filled 2020-01-06: qty 500

## 2020-01-06 NOTE — ED Triage Notes (Signed)
Pt c/o testicle pain x 3 days, denies penile discharge, "slight irritation" when urinating, states there is a painful lump on testicle

## 2020-01-06 NOTE — ED Provider Notes (Signed)
[facility]    CSN: [id number] Arrival date & time: 01/06/20  1509      History   Chief Complaint Chief Complaint  Patient presents with  . Testicle Pain    HPI Joseph Villanueva is a 24 y.o. male.   Patient reports for left-sided testicular pain and swelling.  He also reports urethral discharge.  He reports urethral discharge and itching is been present for almost a month.  He noticed some swelling around the testicle over the last few days.  He notes a small mass bottom of his testicle.  He reports pain is 4 out of 10.  Reports is not bothersome unless he is up standing or sitting directly on it.  Denies nausea or vomiting.  Denies significant abdominal pain.  No fevers or chills.  Patient reports He has had discharge and urethral symptoms for almost a month.     History reviewed. No pertinent past medical history.  There are no problems to display for this patient.   Past Surgical History:  Procedure Laterality Date  . CLOSED REDUCTION MANDIBLE WITH MANDIBULOMA N/A 05/24/2019   Procedure: 1.  Mandibulomaxillary fixation. ;  Surgeon: Newman Pies, MD;  Location: [facility] OR;  Service: ENT;  Laterality: N/A;  . FACIAL LACERATION REPAIR Left 05/24/2019   Procedure: 2.  Complex repair of left eyebrow laceration (4 cm).;  Surgeon: Newman Pies, MD;  Location: [facility] OR;  Service: ENT;  Laterality: Left;  Marland Kitchen MANDIBULAR HARDWARE REMOVAL Bilateral 06/29/2019   Procedure: MANDIBULAR HARDWARE REMOVAL;  Surgeon: Newman Pies, MD;  Location: [facility];  Service: ENT;  Laterality: Bilateral;  . ORIF CLAVICULAR FRACTURE Right 07/27/2019   Procedure: OPEN REDUCTION INTERNAL FIXATION (ORIF) CLAVICULAR FRACTURE;  Surgeon: Myrene Galas, MD;  Location: [facility] OR;  Service: Orthopedics;  Laterality: Right;       Home Medications    Prior to Admission medications   Medication Sig Start Date End Date Taking? Authorizing Provider  doxycycline (VIBRAMYCIN) 100 MG capsule Take 1 capsule  (100 mg total) by mouth 2 (two) times daily. 01/06/20   Derita Michelsen, Veryl Speak, PA-C  ibuprofen (ADVIL) 800 MG tablet Take 1 tablet (800 mg total) by mouth 3 (three) times daily. 01/06/20   Karion Cudd, Veryl Speak, PA-C    Family History Family History  Problem Relation Age of Onset  . Cancer Mother   . Healthy Father     Social History Social History   Tobacco Use  . Smoking status: Never Smoker  . Smokeless tobacco: Never Used  . Tobacco comment: 2-3x per month  Vaping Use  . Vaping Use: Every day  . Substances: Nicotine, Flavoring  . Devices: daily  Substance Use Topics  . Alcohol use: Yes    Comment: 3x per month  . Drug use: Yes    Types: Marijuana    Comment: 3x per week, last time 06/27/2019     Allergies   Patient has no known allergies.   Review of Systems Review of Systems   Physical Exam Triage Vital Signs ED Triage Vitals [01/06/20 1555]  Enc Vitals Group     BP (!) 139/85     Pulse Rate 90     Resp 16     Temp 98.7 F (37.1 C)     Temp src      SpO2 99 %     Weight      Height      Head Circumference      Peak Flow  Pain Score 3     Pain Loc      Pain Edu?      Excl. in GC?    No data found.  Updated Vital Signs BP (!) 139/85   Pulse 90   Temp 98.7 F (37.1 C)   Resp 16   SpO2 99%   Visual Acuity Right Eye Distance:   Left Eye Distance:   Bilateral Distance:    Right Eye Near:   Left Eye Near:    Bilateral Near:     Physical Exam Vitals and nursing note reviewed.  Constitutional:      General: He is not in acute distress.    Appearance: Normal appearance. He is not toxic-appearing.  Cardiovascular:     Rate and Rhythm: Normal rate.  Pulmonary:     Effort: Pulmonary effort is normal. No respiratory distress.  Abdominal:     Tenderness: There is no abdominal tenderness.  Genitourinary:    Comments: Significant epididymal inflammation on the left side with small possible mass posterior to the inferior pole of the testicle.  Tenderness  is primarily in the epididymis and the small mass as opposed to the testicle itself.  Testicles have an equal lie in the sac.  Green discharge at the meatus  No inguinal adenopathy Skin:    General: Skin is warm and dry.  Neurological:     Mental Status: He is alert.      UC Treatments / Results  Labs (all labs ordered are listed, but only abnormal results are displayed) Labs Reviewed  URINE CULTURE  CYTOLOGY, (ORAL, ANAL, URETHRAL) ANCILLARY ONLY    EKG   Radiology No results found.  Procedures Procedures (including critical care time)  Medications Ordered in UC Medications  cefTRIAXone (ROCEPHIN) injection 500 mg (500 mg Intramuscular Given 01/06/20 1730)    Initial Impression / Assessment and Plan / UC Course  I have reviewed the triage vital signs and the nursing notes.  Pertinent labs & imaging results that were available during my care of the patient were reviewed by me and considered in my medical decision making (see chart for details).    #Epididymitis Patient is a 24 year old presenting with what appears to be a significant epididymitis.  Given clear sign of infection with urethral discharge and only mild pain on exam, doubt torsion.  Some concern for development of a small epididymal abscess.  Will give Rocephin here and doxycycline outpatient with instructions to follow-up with urology.  Urethral swab obtained and did send urine culture to evaluate for other etiologies for infection.  Strict emergency department precautions for severe pain, high fevers or inability to tolerate medications.  Patient verbalized understanding plan of care. Final Clinical Impressions(s) / UC Diagnoses   Final diagnoses:  Epididymitis     Discharge Instructions     I believe this is an infection   We have given medicine in clinic Take the antibiotic doxycycline 2 times a day for 10 days, take with plenty of water atleast 16ounces and around meal times Take ibuprofen as  prescribed  I do have concern about the small mass accumulating, call the urology group to have follow up  If severe pain, high fever, unable to tolerate medications, return or go to the Emergency Department      ED Prescriptions    Medication Sig Dispense Auth. Provider   doxycycline (VIBRAMYCIN) 100 MG capsule Take 1 capsule (100 mg total) by mouth 2 (two) times daily. 20 capsule Naji Mehringer, Veryl Speak, PA-C  ibuprofen (ADVIL) 800 MG tablet Take 1 tablet (800 mg total) by mouth 3 (three) times daily. 21 tablet [place] Noori, Veryl Speak, PA-C     PDMP not reviewed this encounter.   Hermelinda Medicus, PA-C 01/06/20 2330

## 2020-01-06 NOTE — Discharge Instructions (Signed)
I believe this is an infection   We have given medicine in clinic Take the antibiotic doxycycline 2 times a day for 10 days, take with plenty of water atleast 16ounces and around meal times Take ibuprofen as prescribed  I do have concern about the small mass accumulating, call the urology group to have follow up  If severe pain, high fever, unable to tolerate medications, return or go to the Emergency Department

## 2020-01-07 LAB — URINE CULTURE: Culture: NO GROWTH

## 2020-01-07 LAB — CYTOLOGY, (ORAL, ANAL, URETHRAL) ANCILLARY ONLY
Chlamydia: POSITIVE — AB
Comment: NEGATIVE
Comment: NEGATIVE
Comment: NORMAL
Neisseria Gonorrhea: POSITIVE — AB
Trichomonas: NEGATIVE

## 2020-01-08 ENCOUNTER — Telehealth (HOSPITAL_COMMUNITY): Payer: Self-pay | Admitting: Physician Assistant

## 2020-01-08 DIAGNOSIS — N451 Epididymitis: Secondary | ICD-10-CM

## 2020-01-08 NOTE — Telephone Encounter (Cosign Needed)
Attempted contact with patient to check on improvement post treatment and on follow up, no answer x 2. General Voice Mail left with precautions to return to urgent care if felt was not having much improvement. No details of patients condition or diagnosis were left on voicemail. Patient was also instructed to have follow up with Urology at time of visit and given general return precautions. Patient had been contacted about his results by call back nurse, no comment on improvement in result note.

## 2020-01-08 NOTE — Telephone Encounter (Cosign Needed)
Patient reached by phone to discuss symptom improvement of recent epidymitis. Patient states pain drastically improved. Swelling around testical seems to be improving some but still present. Was chlamydia and gonorrhea positive.  He started Doxycycline yesterday AM. Rocephin at visit. Ibuprofen for pain. Denies fevers. Discussed that if patient not having significant improvement in swelling over next 24-48 hours to return to UC for re-evaluation. Discussed emergency department precautions. Patient verbalized understanding and had not questions.

## 2020-01-09 ENCOUNTER — Ambulatory Visit (HOSPITAL_COMMUNITY)
Admission: EM | Admit: 2020-01-09 | Discharge: 2020-01-09 | Disposition: A | Discharge status: Home / Self Care | Payer: Self-pay | Attending: Family Medicine | Admitting: Family Medicine

## 2020-01-09 ENCOUNTER — Encounter (HOSPITAL_COMMUNITY): Payer: Self-pay

## 2020-01-09 DIAGNOSIS — N5089 Other specified disorders of the male genital organs: Secondary | ICD-10-CM

## 2020-01-09 DIAGNOSIS — N50812 Left testicular pain: Secondary | ICD-10-CM

## 2020-01-09 MED ORDER — DEXAMETHASONE SODIUM PHOSPHATE 10 MG/ML IJ SOLN
INTRAMUSCULAR | Status: AC
  Filled 2020-01-09: qty 1

## 2020-01-09 MED ORDER — DEXAMETHASONE SODIUM PHOSPHATE 10 MG/ML IJ SOLN
10.0000 mg | Freq: Once | INTRAMUSCULAR | Status: AC
  Administered 2020-01-09: 10 mg via INTRAMUSCULAR

## 2020-01-09 MED ORDER — AMOXICILLIN-POT CLAVULANATE 875-125 MG PO TABS: 1.0000 | ORAL_TABLET | Freq: Two times a day (BID) | ORAL | 0 refills | Status: AC

## 2020-01-09 NOTE — Discharge Instructions (Signed)
I have sent in Augmentin for you to take twice a day for 10 days  Eat with this medication as it can upset your stomach  Follow up with the ER for worsening testicular pain, increased swelling, fever, other concerning symptoms

## 2020-01-09 NOTE — ED Triage Notes (Signed)
Pt present testicle pain that started 5 days ago. Pt was recently seen on 7/22 and since then his testicle has gotten bigger.

## 2020-01-09 NOTE — ED Provider Notes (Addendum)
Joseph Villanueva    CSN: [id number] Arrival date & time: 01/09/20  1106      History   Chief Complaint Chief Complaint  Patient presents with  . Testicle Pain    HPI Joseph Villanueva is a 24 y.o. male.   Reports left testicle pain that has been present for about 5 days. Was seen in this office and was treated for epididymitis with doxycycline. Also received Rocephin injection at last office visit. Reports that urethral burning and discharge have resolved. Reports that he thinks that his left testicle has increased in size since his last visit. Reports that the swelling increase is minimal. Reports that he has not followed up with urology or with the ER. Reports that he does not have insurance and cannot afford urology.   ROS per HPI  The history is provided by the patient.    History reviewed. No pertinent past medical history.  There are no problems to display for this patient.   Past Surgical History:  Procedure Laterality Date  . CLOSED REDUCTION MANDIBLE WITH MANDIBULOMA N/A 05/24/2019   Procedure: 1.  Mandibulomaxillary fixation. ;  Surgeon: Newman Pies, MD;  Location: [facility] OR;  Service: ENT;  Laterality: N/A;  . FACIAL LACERATION REPAIR Left 05/24/2019   Procedure: 2.  Complex repair of left eyebrow laceration (4 cm).;  Surgeon: Newman Pies, MD;  Location: [facility] OR;  Service: ENT;  Laterality: Left;  Marland Kitchen MANDIBULAR HARDWARE REMOVAL Bilateral 06/29/2019   Procedure: MANDIBULAR HARDWARE REMOVAL;  Surgeon: Newman Pies, MD;  Location: [facility];  Service: ENT;  Laterality: Bilateral;  . ORIF CLAVICULAR FRACTURE Right 07/27/2019   Procedure: OPEN REDUCTION INTERNAL FIXATION (ORIF) CLAVICULAR FRACTURE;  Surgeon: Myrene Galas, MD;  Location: [facility] OR;  Service: Orthopedics;  Laterality: Right;       Home Medications    Prior to Admission medications   Medication Sig Start Date End Date Taking? Authorizing Provider  amoxicillin-clavulanate (AUGMENTIN) 875-125 MG  tablet Take 1 tablet by mouth 2 (two) times daily for 10 days. 01/09/20 01/19/20  Moshe Cipro, NP  doxycycline (VIBRAMYCIN) 100 MG capsule Take 1 capsule (100 mg total) by mouth 2 (two) times daily. 01/06/20   Darr, Veryl Speak, PA-C  ibuprofen (ADVIL) 800 MG tablet Take 1 tablet (800 mg total) by mouth 3 (three) times daily. 01/06/20   Darr, Veryl Speak, PA-C    Family History Family History  Problem Relation Age of Onset  . Cancer Mother   . Healthy Father     Social History Social History   Tobacco Use  . Smoking status: Never Smoker  . Smokeless tobacco: Never Used  . Tobacco comment: 2-3x per month  Vaping Use  . Vaping Use: Every day  . Substances: Nicotine, Flavoring  . Devices: daily  Substance Use Topics  . Alcohol use: Yes    Comment: 3x per month  . Drug use: Yes    Types: Marijuana    Comment: 3x per week, last time 06/27/2019     Allergies   Patient has no known allergies.   Review of Systems Review of Systems   Physical Exam Triage Vital Signs ED Triage Vitals  Enc Vitals Group     BP 01/09/20 1156 (!) 133/81     Pulse Rate 01/09/20 1156 72     Resp 01/09/20 1156 16     Temp 01/09/20 1156 98.1 F (36.7 C)     Temp Source 01/09/20 1156 Oral     SpO2 01/09/20  1156 100 %     Weight --      Height --      Head Circumference --      Peak Flow --      Pain Score 01/09/20 1158 3     Pain Loc --      Pain Edu? --      Excl. in GC? --    No data found.  Updated Vital Signs BP (!) 133/81 (BP Location: Right Arm)   Pulse 72   Temp 98.1 F (36.7 C) (Oral)   Resp 16   SpO2 100%     Physical Exam Vitals and nursing note reviewed.  Constitutional:      Appearance: Normal appearance. He is well-developed.  HENT:     Head: Normocephalic and atraumatic.  Eyes:     Conjunctiva/sclera: Conjunctivae normal.  Cardiovascular:     Rate and Rhythm: Normal rate and regular rhythm.     Heart sounds: Normal heart sounds. No murmur heard.   Pulmonary:      Effort: Pulmonary effort is normal. No respiratory distress.     Breath sounds: Normal breath sounds. No stridor. No wheezing, rhonchi or rales.  Chest:     Chest wall: No tenderness.  Abdominal:     Palpations: Abdomen is soft.     Tenderness: There is no abdominal tenderness.  Genitourinary:    Penis: Normal.      Comments: Testicles are nontender. There is epididymal inflammation to left testicle. Tenderness at this area. No urethral discharge.  Musculoskeletal:        General: Normal range of motion.     Cervical back: Neck supple.  Skin:    General: Skin is warm and dry.     Capillary Refill: Capillary refill takes less than 2 seconds.  Neurological:     General: No focal deficit present.     Mental Status: He is alert and oriented to person, place, and time.  Psychiatric:        Mood and Affect: Mood normal.        Behavior: Behavior normal.        Thought Content: Thought content normal.      UC Treatments / Results  Labs (all labs ordered are listed, but only abnormal results are displayed) Labs Reviewed - No data to display  EKG   Radiology No results found.  Procedures Procedures (including critical care time)  Medications Ordered in UC Medications  dexamethasone (DECADRON) injection 10 mg (10 mg Intramuscular Given 01/09/20 1314)    Initial Impression / Assessment and Plan / UC Course  I have reviewed the triage vital signs and the nursing notes.  Pertinent labs & imaging results that were available during my care of the patient were reviewed by me and considered in my medical decision making (see chart for details).     Testicular Pain and swelling  Decadron 10mg  IM in office today to help with swelling Presents with 5 day history of testicular pain and swelling Currently taking doxycycline 100mg  BID Was treated with Rocephin at last visit Urethral discharge has resolved Epididymal inflammation and tenderness to L testicle Will add Augmentin  today in case of possible abscess Strict ER precautions given for any increase in pain or swelling Verbalizes understanding a agrees to treatment plan Final Clinical Impressions(s) / UC Diagnoses   Final diagnoses:  Testicular pain, left  Testicular swelling, left     Discharge Instructions     I have sent  in Augmentin for you to take twice a day for 10 days  Eat with this medication as it can upset your stomach  Follow up with the ER for worsening testicular pain, increased swelling, fever, other concerning symptoms    ED Prescriptions    Medication Sig Dispense Auth. Provider   amoxicillin-clavulanate (AUGMENTIN) 875-125 MG tablet Take 1 tablet by mouth 2 (two) times daily for 10 days. 20 tablet Moshe Cipro, NP     PDMP not reviewed this encounter.   Moshe Cipro, NP 01/12/20 1737    Moshe Cipro, NP 01/12/20 1738

## 2020-03-08 IMAGING — RF DG CLAVICLE*R*
1 series · 3 of 3 positions shown · non-contrast
Comparison: 07/27/2019

CLINICAL DATA: Internal fixation

EXAM:
DG C-ARM 1-60 MIN; RIGHT CLAVICLE - 2+ VIEWS

[Series 1: run · 3 of 3 slices shown]
[im 1/3]
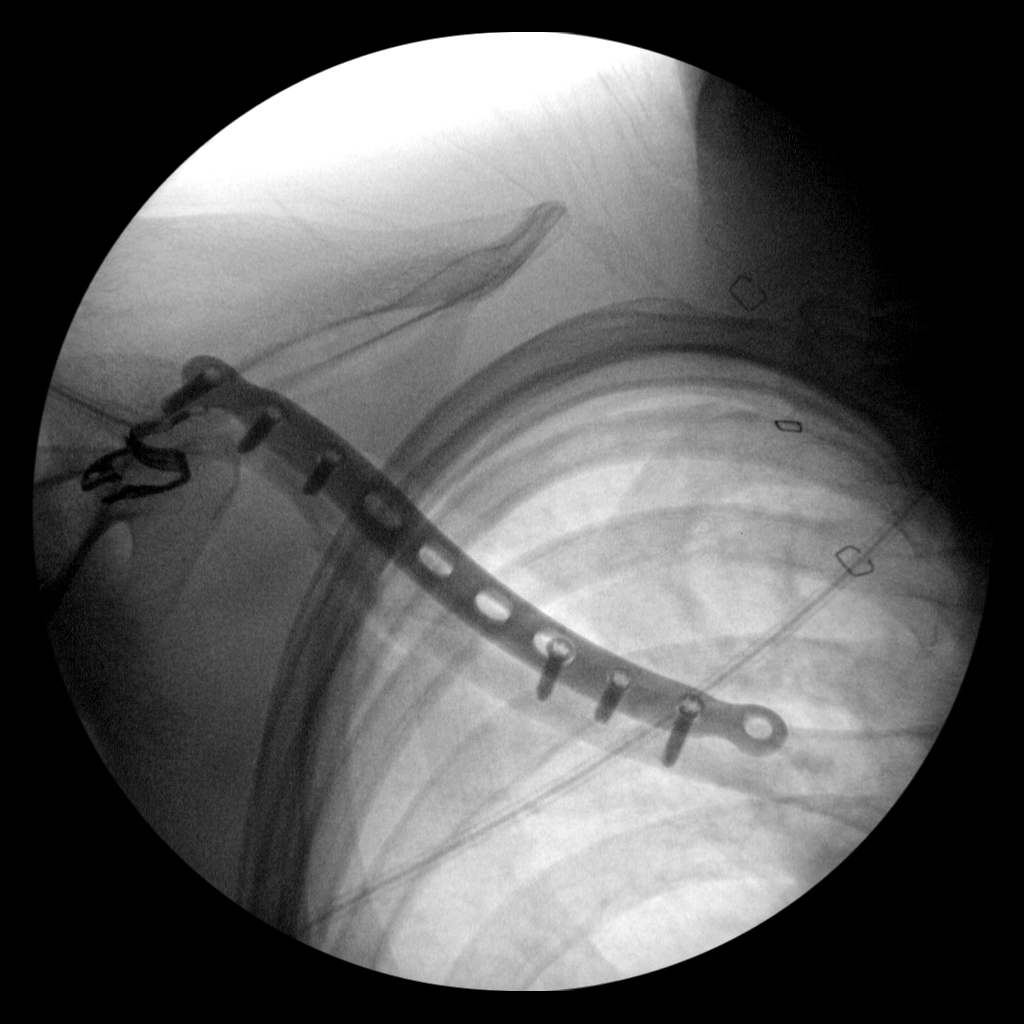
[im 2/3]
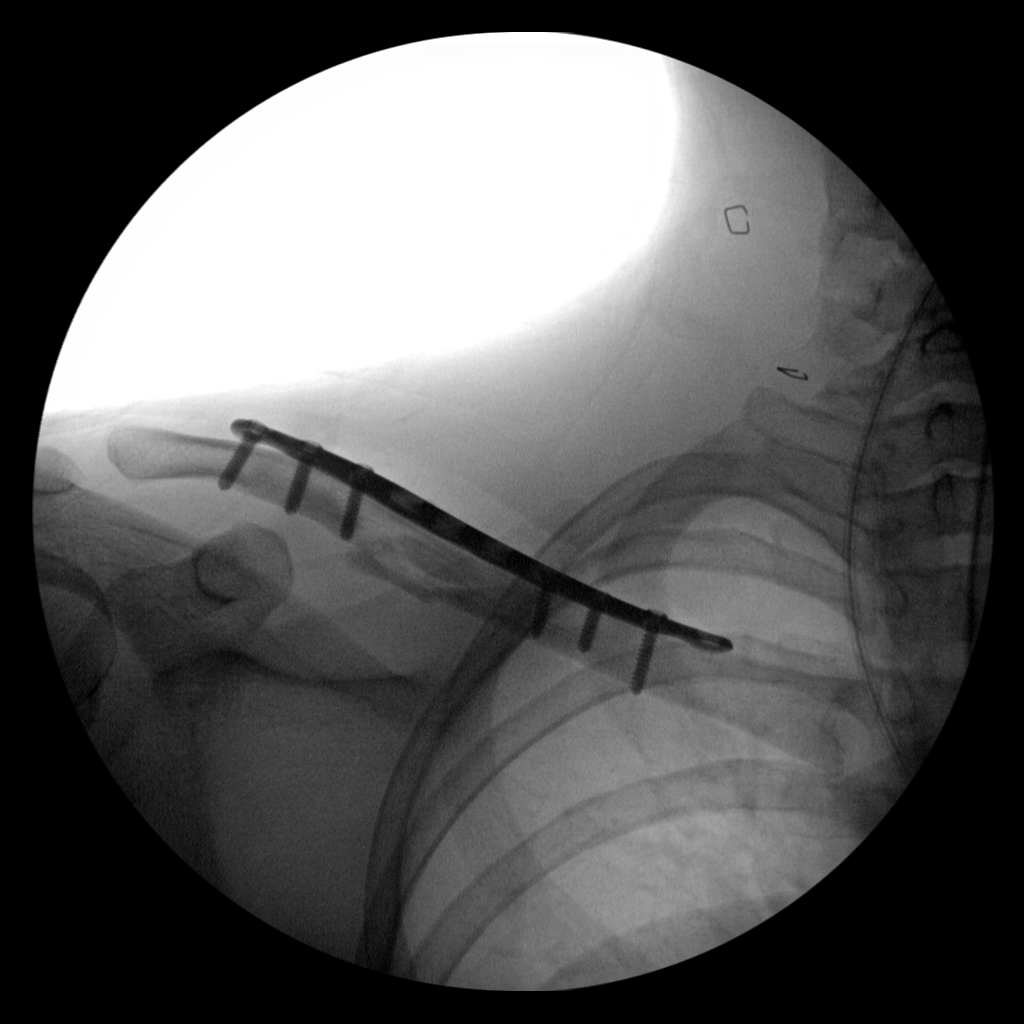
[im 3/3]
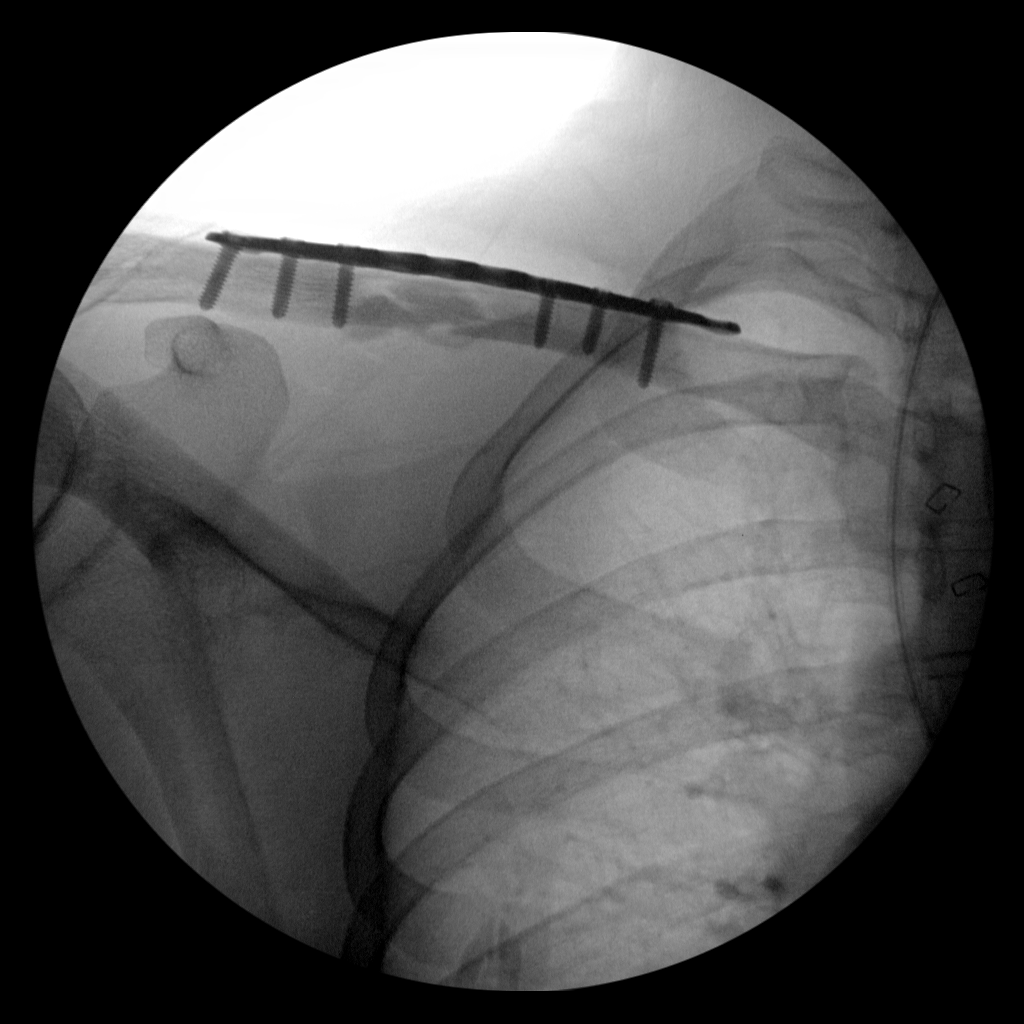

[3 of 3 positions shown; findings below may reference images not displayed]

FINDINGS: Three intraoperative spot images demonstrate plate and screw
fixation across the mid right clavicle fracture. Anatomic alignment.
No hardware complicating feature.
IMPRESSION: Internal fixation.  No visible complicating feature.

## 2020-03-08 IMAGING — DX DG CLAVICLE*R*
1 series · 1 of 1 positions shown · non-contrast
Comparison: 07/27/2019

CLINICAL DATA: ORIF right clavicle

EXAM:
RIGHT CLAVICLE - 2+ VIEWS

[view not recorded]
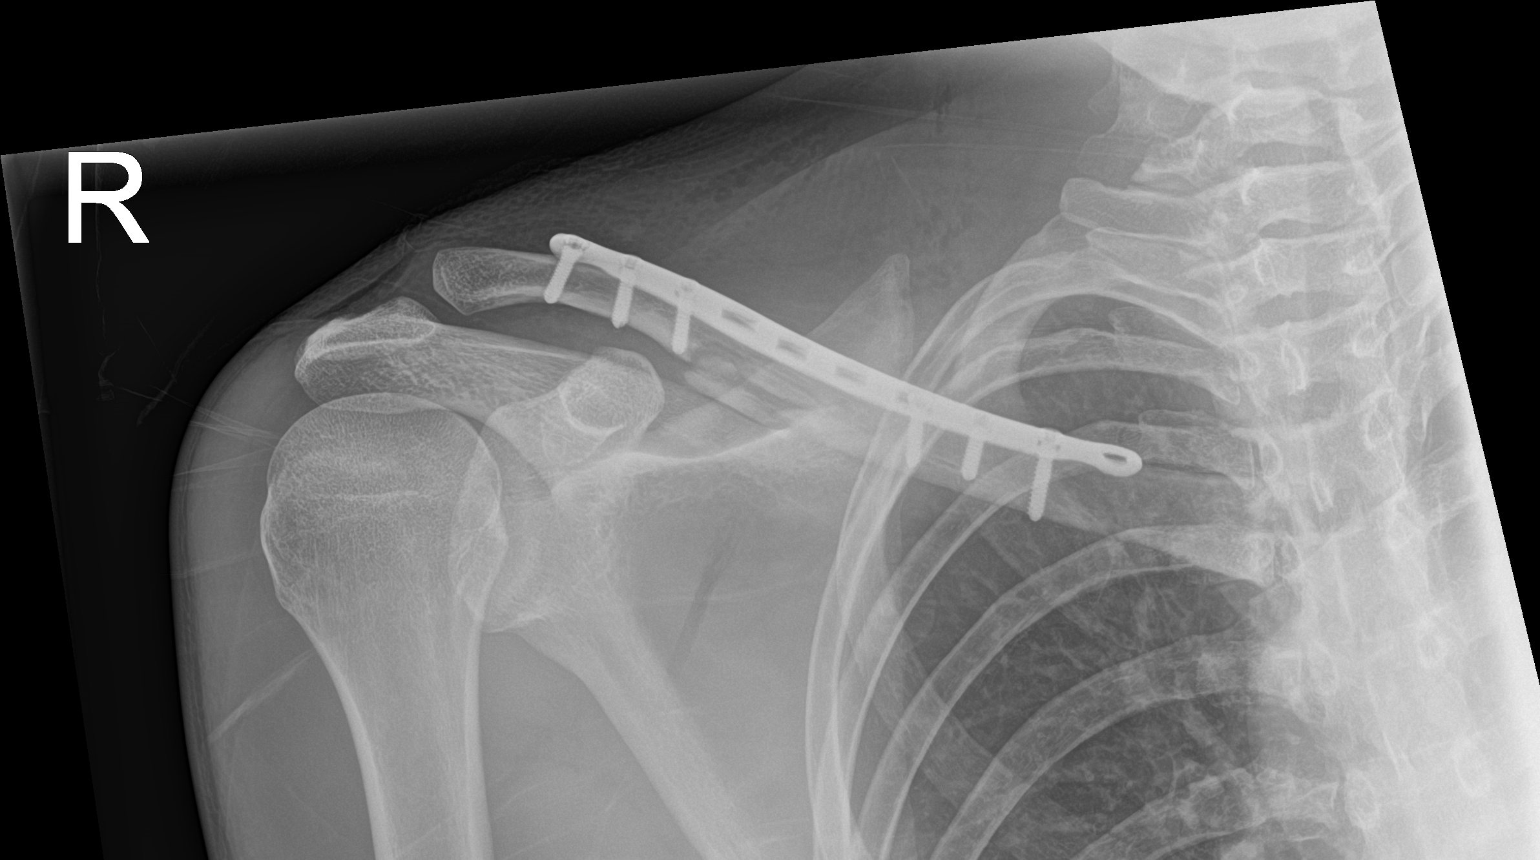

[1 of 1 positions shown; findings below may reference images not displayed]

FINDINGS: Frontal and lordotic views of the right clavicle demonstrate plate
and screw fixation traversing a comminuted midclavicular fracture.
Alignment is anatomic.
IMPRESSION: 1. ORIF right clavicle.

## 2023-07-18 ENCOUNTER — Encounter: Payer: Self-pay | Admitting: Emergency Medicine

## 2023-07-18 ENCOUNTER — Ambulatory Visit
Admission: EM | Admit: 2023-07-18 | Discharge: 2023-07-18 | Disposition: A | Discharge status: Home / Self Care | Payer: Self-pay

## 2023-07-18 DIAGNOSIS — I889 Nonspecific lymphadenitis, unspecified: Secondary | ICD-10-CM

## 2023-07-18 DIAGNOSIS — H60391 Other infective otitis externa, right ear: Secondary | ICD-10-CM

## 2023-07-18 MED ORDER — DOXYCYCLINE HYCLATE 100 MG PO CAPS: 100.0000 mg | ORAL_CAPSULE | Freq: Two times a day (BID) | ORAL | 0 refills | Status: AC

## 2023-07-18 NOTE — ED Provider Notes (Signed)
[facility]    CSN: [id number] Arrival date & time: 07/18/23  1300      History   Chief Complaint Chief Complaint  Patient presents with   Mass    HPI Aizik Reh is a 28 y.o. male who presents with tender lump behind his R since today. He had noticed little tenderness on this area 2 days ago, but there was no lump. He does admit he changed the gage of his current earrring. He did applied neosporin on this area yesterday.     History reviewed. No pertinent past medical history.  There are no active problems to display for this patient.   Past Surgical History:  Procedure Laterality Date   CLOSED REDUCTION MANDIBLE WITH MANDIBULOMA N/A 05/24/2019   Procedure: 1.  Mandibulomaxillary fixation. ;  Surgeon: Newman Pies, MD;  Location: [facility] OR;  Service: ENT;  Laterality: N/A;   FACIAL LACERATION REPAIR Left 05/24/2019   Procedure: 2.  Complex repair of left eyebrow laceration (4 cm).;  Surgeon: Newman Pies, MD;  Location: [facility] OR;  Service: ENT;  Laterality: Left;   MANDIBULAR HARDWARE REMOVAL Bilateral 06/29/2019   Procedure: MANDIBULAR HARDWARE REMOVAL;  Surgeon: Newman Pies, MD;  Location:  SURGERY CENTER;  Service: ENT;  Laterality: Bilateral;   ORIF CLAVICULAR FRACTURE Right 07/27/2019   Procedure: OPEN REDUCTION INTERNAL FIXATION (ORIF) CLAVICULAR FRACTURE;  Surgeon: Myrene Galas, MD;  Location: [facility] OR;  Service: Orthopedics;  Laterality: Right;       Home Medications    Prior to Admission medications   Medication Sig Start Date End Date Taking? Authorizing Provider  ibuprofen (ADVIL) 800 MG tablet Take 1 tablet (800 mg total) by mouth 3 (three) times daily. 01/06/20  Yes Darr, Gerilyn Pilgrim, PA-C  Multiple Vitamin (MULTIVITAMIN WITH MINERALS) TABS tablet Take 1 tablet by mouth daily.   Yes [provider]  doxycycline (VIBRAMYCIN) 100 MG capsule Take 1 capsule (100 mg total) by mouth 2 (two) times daily for 7 days. 07/18/23 07/25/23  Rodriguez-Southworth,  Nettie Elm, PA-C    Family History Family History  Problem Relation Age of Onset   Cancer Mother    Healthy Father    Hodgkin's lymphoma Brother     Social History Social History   Tobacco Use   Smoking status: Never   Smokeless tobacco: Never   Tobacco comments:    2-3x per month  Vaping Use   Vaping status: Every Day   Substances: Nicotine, Flavoring   Devices: daily  Substance Use Topics   Alcohol use: Yes    Comment: 3x per month   Drug use: Yes    Types: Marijuana    Comment: 3x per week, last time 06/27/2019     Allergies   Patient has no known allergies.   Review of Systems Review of Systems  As noted in HPI Physical Exam Triage Vital Signs ED Triage Vitals  Encounter Vitals Group     BP 07/18/23 1646 128/79     Systolic BP Percentile --      Diastolic BP Percentile --      Pulse Rate 07/18/23 1646 86     Resp 07/18/23 1646 16     Temp 07/18/23 1646 98 F (36.7 C)     Temp Source 07/18/23 1646 Oral     SpO2 07/18/23 1646 98 %     Weight --      Height --      Head Circumference --      Peak Flow --  Pain Score 07/18/23 1647 1     Pain Loc --      Pain Education --      Exclude from Growth Chart --    No data found.  Updated Vital Signs BP 128/79 (BP Location: Left Arm)   Pulse 86   Temp 98 F (36.7 C) (Oral)   Resp 16   SpO2 98%   Visual Acuity Right Eye Distance:   Left Eye Distance:   Bilateral Distance:    Right Eye Near:   Left Eye Near:    Bilateral Near:     Physical Exam Vitals and nursing note reviewed.  Constitutional:      General: He is not in acute distress.    Appearance: He is not toxic-appearing.  HENT:     Right Ear: Tympanic membrane and ear canal normal.     Left Ear: Tympanic membrane, ear canal and external ear normal.     Ears:     Comments: His right ear lobe is a little swollen, red and warm compared to the L Eyes:     General: No scleral icterus.    Conjunctiva/sclera: Conjunctivae normal.   Pulmonary:     Effort: Pulmonary effort is normal.  Musculoskeletal:        General: Normal range of motion.     Cervical back: Neck supple.  Lymphadenopathy:     Cervical: No cervical adenopathy.     Comments: Has mobile slightly tender pea size node on R upper cervical chain.   Skin:    General: Skin is warm and dry.     Findings: No rash.  Neurological:     Mental Status: He is alert and oriented to person, place, and time.     Gait: Gait normal.  Psychiatric:        Mood and Affect: Mood normal.        Behavior: Behavior normal.        Thought Content: Thought content normal.        Judgment: Judgment normal.      UC Treatments / Results  Labs (all labs ordered are listed, but only abnormal results are displayed) Labs Reviewed - No data to display  EKG   Radiology No results found.  Procedures Procedures (including critical care time)  Medications Ordered in UC Medications - No data to display  Initial Impression / Assessment and Plan / UC Course  I have reviewed the triage vital signs and the nursing notes.  Inflamed and mild infection of R ear lobe  I placed him on Doxy as noted.  Final Clinical Impressions(s) / UC Diagnoses   Final diagnoses:  Lymphadenitis  Infection of ear lobe, right   Discharge Instructions   None    ED Prescriptions     Medication Sig Dispense Auth. Provider   doxycycline (VIBRAMYCIN) 100 MG capsule Take 1 capsule (100 mg total) by mouth 2 (two) times daily for 7 days. 14 capsule Rodriguez-Southworth, Nettie Elm, PA-C      PDMP not reviewed this encounter.   Garey Ham, [place] 07/18/23 1943

## 2023-07-18 NOTE — ED Triage Notes (Signed)
Pt reports small mass behind R ear noticed today. Noted pain in the area for the past 2 days. No past history of similar masses. Pt reports having general cold symptoms and upset stomach that ended last week. No other notable factors with appearance of lump.   Pt later noted he had just changed the gage of his current earring that could have irritated the area. Applied neosporin to area yesterday.

## 2024-05-19 ENCOUNTER — Encounter: Payer: Self-pay | Admitting: Emergency Medicine

## 2024-05-19 ENCOUNTER — Ambulatory Visit
Admission: EM | Admit: 2024-05-19 | Discharge: 2024-05-19 | Disposition: A | Discharge status: Home / Self Care | Payer: Self-pay

## 2024-05-19 DIAGNOSIS — K047 Periapical abscess without sinus: Secondary | ICD-10-CM

## 2024-05-19 MED ORDER — KETOROLAC TROMETHAMINE 30 MG/ML IJ SOLN
30.0000 mg | Freq: Once | INTRAMUSCULAR | Status: AC
  Administered 2024-05-19: 30 mg via INTRAMUSCULAR

## 2024-05-19 MED ORDER — METHYLPREDNISOLONE 4 MG PO TBPK: ORAL_TABLET | ORAL | 0 refills | Status: AC

## 2024-05-19 MED ORDER — AMOXICILLIN 875 MG PO TABS: 875.0000 mg | ORAL_TABLET | Freq: Two times a day (BID) | ORAL | 0 refills | Status: AC

## 2024-05-19 NOTE — ED Triage Notes (Signed)
 Pt presents c/o dental pain ( left side upper and lower row) x  a while on and off. Pt states,  The pain used to come and go since about June or July but now it's pretty constant and I can' sleep.

## 2024-05-19 NOTE — ED Provider Notes (Signed)
 [facility]    CSN: [id number] Arrival date & time: 05/19/24  1729      History   Chief Complaint Chief Complaint  Patient presents with   Dental Pain    HPI Joseph Villanueva is a 28 y.o. male.   Pt presents today due to dental pain on and off since June or July. Pt states that he does not have dental insurance at this time. Pt states that he has been using Tylenol , ibuprofen , and essential oils for dental pain with no significant relief.   The history is provided by the patient.  Dental Pain   History reviewed. No pertinent past medical history.  There are no active problems to display for this patient.   Past Surgical History:  Procedure Laterality Date   CLOSED REDUCTION MANDIBLE WITH MANDIBULOMA N/A 05/24/2019   Procedure: 1.  Mandibulomaxillary fixation. ;  Surgeon: Karis Clunes, MD;  Location: [facility] OR;  Service: ENT;  Laterality: N/A;   FACIAL LACERATION REPAIR Left 05/24/2019   Procedure: 2.  Complex repair of left eyebrow laceration (4 cm).;  Surgeon: Karis Clunes, MD;  Location: [facility] OR;  Service: ENT;  Laterality: Left;   MANDIBULAR HARDWARE REMOVAL Bilateral 06/29/2019   Procedure: MANDIBULAR HARDWARE REMOVAL;  Surgeon: Karis Clunes, MD;  Location: [facility];  Service: ENT;  Laterality: Bilateral;   ORIF CLAVICULAR FRACTURE Right 07/27/2019   Procedure: OPEN REDUCTION INTERNAL FIXATION (ORIF) CLAVICULAR FRACTURE;  Surgeon: Celena Sharper, MD;  Location: [facility] OR;  Service: Orthopedics;  Laterality: Right;       Home Medications    Prior to Admission medications   Medication Sig Start Date End Date Taking? Authorizing Provider  amoxicillin  (AMOXIL ) 875 MG tablet Take 1 tablet (875 mg total) by mouth 2 (two) times daily for 10 days. 05/19/24 05/29/24 Yes Andra Corean BROCKS, PA-C  Aspirin-Salicylamide-Caffeine (BC HEADACHE POWDER PO) Take by mouth.   Yes [provider]  methylPREDNISolone (MEDROL DOSEPAK) 4 MG TBPK tablet Take as  directed on back of package 05/19/24  Yes Andra Corean C, PA-C  ibuprofen  (ADVIL ) 800 MG tablet Take 1 tablet (800 mg total) by mouth 3 (three) times daily. 01/06/20   Darr, Jacob, PA-C  Multiple Vitamin (MULTIVITAMIN WITH MINERALS) TABS tablet Take 1 tablet by mouth daily.    [provider]    Family History Family History  Problem Relation Age of Onset   Cancer Mother    Healthy Father    Hodgkin's lymphoma Brother     Social History Social History   Tobacco Use   Smoking status: Never    Passive exposure: Current   Smokeless tobacco: Never   Tobacco comments:    2-3x per month  Vaping Use   Vaping status: Every Day   Substances: Nicotine, Flavoring   Devices: daily  Substance Use Topics   Alcohol use: Yes    Comment: 3x per month   Drug use: Yes    Types: Marijuana    Comment: 3x per week, last time 06/27/2019     Allergies   Patient has no known allergies.   Review of Systems Review of Systems   Physical Exam Triage Vital Signs ED Triage Vitals  Encounter Vitals Group     BP 05/19/24 1818 (!) 157/107     Girls Systolic BP Percentile --      Girls Diastolic BP Percentile --      Boys Systolic BP Percentile --      Boys Diastolic BP Percentile --  Pulse Rate 05/19/24 1818 (!) 115     Resp 05/19/24 1818 18     Temp 05/19/24 1818 97.8 F (36.6 C)     Temp Source 05/19/24 1818 Oral     SpO2 05/19/24 1818 96 %     Weight 05/19/24 1815 175 lb (79.4 kg)     Height --      Head Circumference --      Peak Flow --      Pain Score 05/19/24 1815 5     Pain Loc --      Pain Education --      Exclude from Growth Chart --    No data found.  Updated Vital Signs BP (!) 157/107 (BP Location: Left Arm)   Pulse (!) 115   Temp 97.8 F (36.6 C) (Oral)   Resp 18   Wt 175 lb (79.4 kg)   SpO2 96%   BMI 22.47 kg/m   Visual Acuity Right Eye Distance:   Left Eye Distance:   Bilateral Distance:    Right Eye Near:   Left Eye Near:     Bilateral Near:     Physical Exam Vitals and nursing note reviewed.  Constitutional:      General: He is not in acute distress.    Appearance: Normal appearance. He is not ill-appearing, toxic-appearing or diaphoretic.  HENT:     Mouth/Throat:     Dentition: Abnormal dentition. Dental tenderness, gingival swelling, dental caries and gum lesions present.  Eyes:     General: No scleral icterus. Cardiovascular:     Rate and Rhythm: Regular rhythm. Tachycardia present.     Heart sounds: Normal heart sounds.  Pulmonary:     Effort: Pulmonary effort is normal. No respiratory distress.     Breath sounds: Normal breath sounds. No wheezing or rhonchi.  Skin:    General: Skin is warm.  Neurological:     Mental Status: He is alert and oriented to person, place, and time.  Psychiatric:        Mood and Affect: Mood normal.        Behavior: Behavior normal.      UC Treatments / Results  Labs (all labs ordered are listed, but only abnormal results are displayed) Labs Reviewed - No data to display  EKG   Radiology No results found.  Procedures Procedures (including critical care time)  Medications Ordered in UC Medications  ketorolac (TORADOL) 30 MG/ML injection 30 mg (30 mg Intramuscular Given 05/19/24 1901)    Initial Impression / Assessment and Plan / UC Course  I have reviewed the triage vital signs and the nursing notes.  Pertinent labs & imaging results that were available during my care of the patient were reviewed by me and considered in my medical decision making (see chart for details).    Final Clinical Impressions(s) / UC Diagnoses   Final diagnoses:  Dental infection     Discharge Instructions      Follow-up with dentist as soon as possible    ED Prescriptions     Medication Sig Dispense Auth. Provider   amoxicillin  (AMOXIL ) 875 MG tablet Take 1 tablet (875 mg total) by mouth 2 (two) times daily for 10 days. 20 tablet Andra Krabbe C, PA-C    methylPREDNISolone (MEDROL DOSEPAK) 4 MG TBPK tablet Take as directed on back of package 21 tablet Andra Krabbe BROCKS, PA-C      PDMP not reviewed this encounter.   Andra Krabbe BROCKS, PA-C 05/19/24 1902

## 2024-05-19 NOTE — Discharge Instructions (Signed)
Follow up with dentist as soon as possible.
